# Patient Record
Sex: Female | Born: 1950 | Race: White | Hispanic: No | Marital: Married | Smoking: Former smoker
Health system: Southern US, Community
[De-identification: ages and names within clinical notes are randomized; demographics above are authoritative.]

## PROBLEM LIST (undated history)

## (undated) DIAGNOSIS — K219 Gastro-esophageal reflux disease without esophagitis: Secondary | ICD-10-CM

## (undated) DIAGNOSIS — E785 Hyperlipidemia, unspecified: Secondary | ICD-10-CM

## (undated) DIAGNOSIS — M1990 Unspecified osteoarthritis, unspecified site: Secondary | ICD-10-CM

## (undated) DIAGNOSIS — R112 Nausea with vomiting, unspecified: Secondary | ICD-10-CM

## (undated) DIAGNOSIS — Z9889 Other specified postprocedural states: Secondary | ICD-10-CM

## (undated) DIAGNOSIS — F419 Anxiety disorder, unspecified: Secondary | ICD-10-CM

## (undated) DIAGNOSIS — I447 Left bundle-branch block, unspecified: Secondary | ICD-10-CM

## (undated) DIAGNOSIS — I10 Essential (primary) hypertension: Secondary | ICD-10-CM

## (undated) DIAGNOSIS — R0600 Dyspnea, unspecified: Secondary | ICD-10-CM

## (undated) HISTORY — PX: UPPER GI ENDOSCOPY: SHX6162

## (undated) HISTORY — PX: BREAST SURGERY: SHX581

## (undated) HISTORY — PX: DEBRIDEMENT LEG: SUR390

## (undated) HISTORY — PX: TUBAL LIGATION: SHX77

## (undated) HISTORY — DX: Essential (primary) hypertension: I10

## (undated) HISTORY — PX: ANUS SURGERY: SHX302

## (undated) HISTORY — PX: CHOLECYSTECTOMY: SHX55

## (undated) HISTORY — DX: Hyperlipidemia, unspecified: E78.5

## (undated) HISTORY — PX: COLONOSCOPY: SHX174

## (undated) HISTORY — DX: Left bundle-branch block, unspecified: I44.7

## (undated) HISTORY — PX: OTHER SURGICAL HISTORY: SHX169

---

## 2001-12-18 ENCOUNTER — Encounter: Admission: RE | Admit: 2001-12-18 | Discharge: 2001-12-18 | Payer: Self-pay

## 2002-06-14 ENCOUNTER — Encounter: Admission: RE | Admit: 2002-06-14 | Discharge: 2002-06-14 | Payer: Self-pay

## 2005-03-29 ENCOUNTER — Encounter: Admission: RE | Admit: 2005-03-29 | Discharge: 2005-03-29 | Payer: Self-pay | Admitting: *Deleted

## 2009-09-25 ENCOUNTER — Encounter: Admission: RE | Admit: 2009-09-25 | Discharge: 2009-09-25 | Payer: Self-pay

## 2009-09-30 ENCOUNTER — Encounter: Admission: RE | Admit: 2009-09-30 | Discharge: 2009-09-30 | Payer: Self-pay

## 2011-01-20 ENCOUNTER — Other Ambulatory Visit: Payer: Self-pay | Admitting: *Deleted

## 2011-01-20 DIAGNOSIS — Z1231 Encounter for screening mammogram for malignant neoplasm of breast: Secondary | ICD-10-CM

## 2011-01-27 ENCOUNTER — Ambulatory Visit
Admission: RE | Admit: 2011-01-27 | Discharge: 2011-01-27 | Disposition: A | Discharge status: Home / Self Care | Payer: BC Managed Care – PPO | Source: Ambulatory Visit | Attending: *Deleted | Admitting: *Deleted

## 2011-01-27 DIAGNOSIS — Z1231 Encounter for screening mammogram for malignant neoplasm of breast: Secondary | ICD-10-CM

## 2012-02-02 ENCOUNTER — Other Ambulatory Visit: Payer: Self-pay | Admitting: *Deleted

## 2012-02-02 ENCOUNTER — Other Ambulatory Visit: Payer: Self-pay | Admitting: Obstetrics & Gynecology

## 2012-02-02 DIAGNOSIS — Z1231 Encounter for screening mammogram for malignant neoplasm of breast: Secondary | ICD-10-CM

## 2012-02-17 ENCOUNTER — Other Ambulatory Visit: Payer: Self-pay

## 2012-02-17 ENCOUNTER — Ambulatory Visit
Admission: RE | Admit: 2012-02-17 | Discharge: 2012-02-17 | Disposition: A | Discharge status: Home / Self Care | Payer: BC Managed Care – PPO | Source: Ambulatory Visit | Attending: *Deleted | Admitting: *Deleted

## 2012-02-17 DIAGNOSIS — Z1231 Encounter for screening mammogram for malignant neoplasm of breast: Secondary | ICD-10-CM

## 2013-03-18 ENCOUNTER — Other Ambulatory Visit: Payer: Self-pay

## 2013-03-18 DIAGNOSIS — Z1231 Encounter for screening mammogram for malignant neoplasm of breast: Secondary | ICD-10-CM

## 2013-04-15 ENCOUNTER — Ambulatory Visit
Admission: RE | Admit: 2013-04-15 | Discharge: 2013-04-15 | Disposition: A | Discharge status: Home / Self Care | Payer: BC Managed Care – PPO | Source: Ambulatory Visit

## 2013-04-15 DIAGNOSIS — Z1231 Encounter for screening mammogram for malignant neoplasm of breast: Secondary | ICD-10-CM

## 2015-06-18 ENCOUNTER — Telehealth: Payer: Self-pay | Admitting: Cardiovascular Disease

## 2015-06-18 NOTE — Telephone Encounter (Signed)
Received records from [facility] for appointment on 07/14/15 with Dr Royann Shiversroitoru.  [facility] for Dr Croitoru's schedule on 07/14/15. lp

## 2015-07-14 ENCOUNTER — Encounter: Payer: Self-pay | Admitting: Cardiovascular Disease

## 2015-07-14 ENCOUNTER — Ambulatory Visit (INDEPENDENT_AMBULATORY_CARE_PROVIDER_SITE_OTHER): Payer: BC Managed Care – PPO | Admitting: Cardiovascular Disease

## 2015-07-14 VITALS — BP 189/62 | HR 56 | Resp 16 | Ht 64.0 in | Wt 160.0 lb

## 2015-07-14 DIAGNOSIS — R079 Chest pain, unspecified: Secondary | ICD-10-CM | POA: Diagnosis not present

## 2015-07-14 DIAGNOSIS — E785 Hyperlipidemia, unspecified: Secondary | ICD-10-CM

## 2015-07-14 DIAGNOSIS — I2089 Other forms of angina pectoris: Secondary | ICD-10-CM

## 2015-07-14 DIAGNOSIS — I447 Left bundle-branch block, unspecified: Secondary | ICD-10-CM | POA: Insufficient documentation

## 2015-07-14 DIAGNOSIS — R0609 Other forms of dyspnea: Secondary | ICD-10-CM | POA: Diagnosis not present

## 2015-07-14 DIAGNOSIS — I10 Essential (primary) hypertension: Secondary | ICD-10-CM

## 2015-07-14 DIAGNOSIS — I208 Other forms of angina pectoris: Secondary | ICD-10-CM | POA: Diagnosis not present

## 2015-07-14 HISTORY — DX: Essential (primary) hypertension: I10

## 2015-07-14 HISTORY — DX: Left bundle-branch block, unspecified: I44.7

## 2015-07-14 HISTORY — DX: Hyperlipidemia, unspecified: E78.5

## 2015-07-14 NOTE — Progress Notes (Signed)
Patient [place]: [place], female   DOB: October 14, 1950, 65 y.o.   MRN: [id number]     Cardiology Office Note   Date:  07/14/2015   [place]:  [place], DOB [birth year], MRN [id number]  PCP:  No primary care provider on file.  Cardiologist:   Thurmon Fair, MD   Chief Complaint  Patient presents with  . New Patient (Initial Visit)  . Shortness of Breath    walking alot      History of Present Illness: [place] is a 64 y.o. female who presents for  Evaluation of exertional dyspnea.   [place] is married to [place], whom I recently saw for syncope and loop recorder implantation.  She has noticed gradually worsening exertional tolerance over the last 9-12 months. She complained initially of dyspnea. This happens when she climbs the stairs to the church balcony or when she is  Carrying groceries up the steps at home. As we were talking, it also became clear that the dyspnea also associates a sensation of pressure in her throat and lower jaw. The symptoms improve gradually after a few minutes of rest.  She denies syncope, palpitations, edema, claudication , focal neurological deficits are other vascular symptoms.   she has noticed that her symptoms of exertional throat tightness and dyspnea are worse if she is delayed in taking her usual atenolol dose.  She was initially diagnosed with a left bundle branch block about 12 years ago and at that time underwent an echocardiogram and a vasodilator myocardial nuclear perfusion study , both normal.  The nuclear study reported LVEF of 48% with hypokinesis of the distal third of the anterior wall ( probably LBBB-related asynchrony) , whilst the echo reported EF 55-65 percent with normal wall motion. No valvular abnormalities seen.  She smoked for about 10 years, 1 pack a day and quit in 1982. Her mother used nitroglycerin for angina.  She usually takes a statin, currently interrupted temporarily to allow use of terbinafine for  onychomycosis  She lives in [place], but is in [place] 5 days a week providing care for her 49-year-old and 7-month-old grandchildren  Past Medical History  Diagnosis Date  . Hyperlipidemia 07/14/2015  . Essential hypertension 07/14/2015  . LBBB (left bundle branch block) 07/14/2015    History reviewed. No pertinent past surgical history.   Current Outpatient Prescriptions  Medication Sig Dispense Refill  . atenolol-chlorthalidone (TENORETIC) 50-25 MG tablet Take 1 tablet by mouth daily.    . Cholecalciferol (VITAMIN D3) 2000 UNITS TABS Take 2,000 Units by mouth daily.    Marland Kitchen doxycycline (VIBRA-TABS) 100 MG tablet Take 100 mg by mouth 3 (three) times a week. 1-3 times weekly    . Multiple Vitamins-Minerals (MULTIVITAMIN ADULT PO) Take by mouth daily.    Marland Kitchen POTASSIUM CHLORIDE PO Take 10 mEq by mouth daily.    . pravastatin (PRAVACHOL) 40 MG tablet Take 40 mg by mouth daily.    Marland Kitchen terbinafine (LAMISIL) 250 MG tablet Take 250 mg by mouth daily.     No current facility-administered medications for this visit.    Allergies:   Review of patient's allergies indicates no known allergies.    Social History:  The patient  reports that she has quit smoking. She has never used smokeless tobacco. She reports that she drinks alcohol. She reports that she does not use illicit drugs.   Family History:  The patient's family history includes Diabetes in her mother; Hypertension in her mother; Lung cancer in her father.  ROS:  Please see the history of present illness.    Otherwise, review of systems positive for none.   All other systems are reviewed and negative.    PHYSICAL EXAM: VS:  BP 189/62 mmHg  Pulse 56  Resp 16  Ht 5\' 4"  (1.626 m)  Wt 160 lb (72.576 kg)  BMI 27.45 kg/m2 , BMI Body mass index is 27.45 kg/(m^2).  General: Alert, oriented x3, no distress Head: no evidence of trauma, PERRL, EOMI, no exophtalmos or lid lag, no myxedema, no xanthelasma; normal ears, nose and  oropharynx Neck: normal jugular venous pulsations and no hepatojugular reflux; brisk carotid pulses without delay and no carotid bruits Chest: clear to auscultation, no signs of consolidation by percussion or palpation, normal fremitus, symmetrical and full respiratory excursions Cardiovascular: normal position and quality of the apical impulse, regular rhythm, normal first and second heart sounds, no murmurs, rubs or gallops Abdomen: no tenderness or distention, no masses by palpation, no abnormal pulsatility or arterial bruits, normal bowel sounds, no hepatosplenomegaly Extremities: no clubbing, cyanosis or edema; 2+ radial, ulnar and brachial pulses bilaterally; 2+ right femoral, posterior tibial and dorsalis pedis pulses; 2+ left femoral, posterior tibial and dorsalis pedis pulses; no subclavian or femoral bruits Neurological: grossly nonfocal Psych: euthymic mood, full affect   EKG:  EKG is ordered today. The ekg ordered today demonstrates NSR, LBBB   Recent Labs:  hemoglobin A1c 6.1%, uric acid 9.4 , potassium 3.8, creatinine 0.99, normal LFTs    Lipid Panel  total cholesterol 212, triglycerides 156, HDL 62, LDL 119    Wt Readings from Last 3 Encounters:  07/14/15 160 lb (72.576 kg)      Other studies Reviewed: Additional studies/ records that were reviewed today include:  [facility] labs [place] [facility] [place]   ASSESSMENT AND PLAN:    Mrs. Willaim Baneark describes fairly typical exertional angina pectoris and exertional dyspnea and has numerous risk factors for coronary artery disease including family history of CAD and a female relative, mild hyperlipidemia, borderline diabetes mellitus treated hypertension.  Her echocardiogram is nondiagnostic due to the presence of a chronic left bundle branch block. I have recommended that she have a Lexiscan Myoview and an echocardiogram. As long as his symptoms remain in a stable/exertional pattern, there is no urgency to  her evaluation. However, should she have persistent chest/throat discomfort that lasts for more than 20-25 minutes or if there is any pattern of progressive/accelerating symptoms she should seek urgent medical attention.   If her stress test is abnormal she should have a cardiac catheterization and coronary angiography. We actually discussed the risks/benefits/potential complications of diagnostic cardiac catheterization as well as of potential percutaneous angioplasty and stent placement in detail today.   her blood pressure is quite high today , but this is atypical. May need adjustment in her antihypertensive regimen but will get the opportunity to repeatedly check her blood pressure when she comes in for her nuclear test.   Reviewed the risk of beta blocker rebound symptoms.  Current medicines are reviewed at length with the patient today.  The patient does not have concerns regarding medicines.  The following changes have been made:  no change  Labs/ tests ordered today include:  Orders Placed This Encounter  Procedures  . Myocardial Perfusion Imaging  . EKG 12-Lead  . ECHOCARDIOGRAM COMPLETE    Patient Instructions  Your physician has requested that you have an echocardiogram. Echocardiography is a painless test that uses sound waves to  create images of your heart. It provides your doctor with information about the size and shape of your heart and how well your heart's chambers and valves are working. This procedure takes approximately one hour. There are no restrictions for this procedure.  Your physician has requested that you have a lexiscan myoview. For further information please visit [email or web address] Please follow instruction sheet, as given.  Dr. Royann Shivers recommends that you schedule a follow-up appointment in: ONE MONTH       Signed, Thurmon Fair, MD  07/14/2015 1:27 PM    Thurmon Fair, MD, [facility] [phone] office [phone] pager

## 2015-07-14 NOTE — Patient Instructions (Signed)
Your physician has requested that you have an echocardiogram. Echocardiography is a painless test that uses sound waves to create images of your heart. It provides your doctor with information about the size and shape of your heart and how well your heart's chambers and valves are working. This procedure takes approximately one hour. There are no restrictions for this procedure.  Your physician has requested that you have a lexiscan myoview. For further information please visit [email or web address]. Please follow instruction sheet, as given.  Dr. Royann Shiversroitoru recommends that you schedule a follow-up appointment in: ONE MONTH

## 2015-07-22 ENCOUNTER — Telehealth (HOSPITAL_COMMUNITY): Payer: Self-pay

## 2015-07-22 NOTE — Telephone Encounter (Signed)
Encounter complete. 

## 2015-07-24 ENCOUNTER — Ambulatory Visit (HOSPITAL_COMMUNITY)
Admission: RE | Admit: 2015-07-24 | Discharge: 2015-07-24 | Disposition: A | Discharge status: Home / Self Care | Payer: BC Managed Care – PPO | Source: Ambulatory Visit | Attending: Cardiovascular Disease | Admitting: Cardiovascular Disease

## 2015-07-24 DIAGNOSIS — I447 Left bundle-branch block, unspecified: Secondary | ICD-10-CM | POA: Insufficient documentation

## 2015-07-24 DIAGNOSIS — Z87891 Personal history of nicotine dependence: Secondary | ICD-10-CM | POA: Insufficient documentation

## 2015-07-24 DIAGNOSIS — R0609 Other forms of dyspnea: Secondary | ICD-10-CM | POA: Diagnosis not present

## 2015-07-24 DIAGNOSIS — R5383 Other fatigue: Secondary | ICD-10-CM | POA: Insufficient documentation

## 2015-07-24 DIAGNOSIS — R079 Chest pain, unspecified: Secondary | ICD-10-CM

## 2015-07-24 DIAGNOSIS — R002 Palpitations: Secondary | ICD-10-CM | POA: Insufficient documentation

## 2015-07-24 DIAGNOSIS — Z8249 Family history of ischemic heart disease and other diseases of the circulatory system: Secondary | ICD-10-CM | POA: Insufficient documentation

## 2015-07-24 DIAGNOSIS — I10 Essential (primary) hypertension: Secondary | ICD-10-CM | POA: Diagnosis not present

## 2015-07-24 LAB — MYOCARDIAL PERFUSION IMAGING
CHL CUP NUCLEAR SRS: 3
CHL CUP NUCLEAR SSS: 4
CSEPPHR: 85 {beats}/min
LV dias vol: 104 mL
LVSYSVOL: 47 mL
NUC STRESS TID: 1.21
Rest HR: 55 {beats}/min
SDS: 1

## 2015-07-24 MED ORDER — TECHNETIUM TC 99M SESTAMIBI GENERIC - CARDIOLITE
32.0000 | Freq: Once | INTRAVENOUS | Status: AC | PRN
  Administered 2015-07-24: 32 via INTRAVENOUS

## 2015-07-24 MED ORDER — AMINOPHYLLINE 25 MG/ML IV SOLN
75.0000 mg | Freq: Once | INTRAVENOUS | Status: AC
  Administered 2015-07-24: 75 mg via INTRAVENOUS

## 2015-07-24 MED ORDER — REGADENOSON 0.4 MG/5ML IV SOLN
0.4000 mg | Freq: Once | INTRAVENOUS | Status: AC
  Administered 2015-07-24: 0.4 mg via INTRAVENOUS

## 2015-07-24 MED ORDER — TECHNETIUM TC 99M SESTAMIBI GENERIC - CARDIOLITE
10.7000 | Freq: Once | INTRAVENOUS | Status: AC | PRN
  Administered 2015-07-24: 10.7 via INTRAVENOUS

## 2015-07-27 ENCOUNTER — Other Ambulatory Visit: Payer: Self-pay

## 2015-07-27 ENCOUNTER — Ambulatory Visit (HOSPITAL_COMMUNITY): Payer: BC Managed Care – PPO | Attending: Cardiology

## 2015-07-27 DIAGNOSIS — E785 Hyperlipidemia, unspecified: Secondary | ICD-10-CM | POA: Diagnosis not present

## 2015-07-27 DIAGNOSIS — I059 Rheumatic mitral valve disease, unspecified: Secondary | ICD-10-CM | POA: Diagnosis not present

## 2015-07-27 DIAGNOSIS — I10 Essential (primary) hypertension: Secondary | ICD-10-CM | POA: Diagnosis not present

## 2015-07-27 DIAGNOSIS — R079 Chest pain, unspecified: Secondary | ICD-10-CM

## 2015-07-27 DIAGNOSIS — I351 Nonrheumatic aortic (valve) insufficiency: Secondary | ICD-10-CM | POA: Diagnosis not present

## 2015-08-10 ENCOUNTER — Ambulatory Visit (INDEPENDENT_AMBULATORY_CARE_PROVIDER_SITE_OTHER): Payer: Medicare Other | Admitting: Cardiovascular Disease

## 2015-08-10 VITALS — BP 152/76 | HR 76 | Ht 64.0 in | Wt 162.5 lb

## 2015-08-10 DIAGNOSIS — I447 Left bundle-branch block, unspecified: Secondary | ICD-10-CM | POA: Diagnosis not present

## 2015-08-10 DIAGNOSIS — I208 Other forms of angina pectoris: Secondary | ICD-10-CM

## 2015-08-10 DIAGNOSIS — I10 Essential (primary) hypertension: Secondary | ICD-10-CM

## 2015-08-10 DIAGNOSIS — E785 Hyperlipidemia, unspecified: Secondary | ICD-10-CM

## 2015-08-10 DIAGNOSIS — R0609 Other forms of dyspnea: Secondary | ICD-10-CM

## 2015-08-10 MED ORDER — AMLODIPINE BESYLATE 5 MG PO TABS: 5.0000 mg | ORAL_TABLET | Freq: Every day | ORAL | Status: DC

## 2015-08-10 NOTE — Patient Instructions (Signed)
Your physician has recommended you make the following change in your medication: START AMLODIPINE 5 MG DAILY  If you need a refill on your cardiac medications before your next appointment, please call your pharmacy.  Your physician has requested that you regularly monitor and record your blood pressure readings at home. Please use the same machine at the same time of day to check your readings and record them AND SEND READINGS AFTER ONE WEEK THROUGH MYCHART.  Dr. Croitoru rRoyann Shiversecommends that you schedule a follow-up appointment in: ONE YEAR    Blood Pressure Record Sheet  BLOOD PRESSURE LOG Date: _______________________  a.m. _____________________  p.m. _____________________ Date: _______________________  a.m. _____________________  p.m. _____________________ Date: _______________________  a.m. _____________________  p.m. _____________________ Date: _______________________  a.m. _____________________  p.m. _____________________ Date: _______________________  a.m. _____________________  p.m. _____________________   This information is not intended to replace advice given to you by your health care provider. Make sure you discuss any questions you have with your health care provider.   Document Released: 05/21/2003 Document Revised: 09/12/2014 Document Reviewed: 10/15/2013 Elsevier Interactive Patient Education [employer]2016 Elsevier Inc.

## 2015-08-12 ENCOUNTER — Encounter: Payer: Self-pay | Admitting: Cardiovascular Disease

## 2015-08-12 NOTE — Progress Notes (Signed)
Patient ID: [place], female   DOB: November 19, 1950, 64 y.o.   MRN: [id number]     Cardiology Office Note   Date:  08/12/2015   ID:  [place], DOB November 19, 1950, MRN [id number]  PCP:  Pcp Not In System  Cardiologist:   [place] FairROITORU,Foch Rosenwald, MD   Chief Complaint  Patient presents with  . Follow-up    pt states no chest pain, no edema no light headedness or dizziness, pt states she hasn't taken A.M. meds so BP is elevated  . Shortness of Breath    only with exertion      History of Present Illness: [place] is a 64 y.o. female who presents for  Follow-up after undergoing echocardiogram and a nuclear stress testing for complaints of exertional dyspnea and chest discomfort. When she came for her  Nuclear stress test, her blood pressure was persistently and consistently elevated. Today in the office her blood pressure is again high. The highest blood pressure during Lexiscan infusion was 185 /67 mmHg. Both the echocardiogram and a nuclear stress test showed benign findings. There was no evidence of reversible ischemia and left ventricular systolic function was normal.  It is quite possible that she has hypertension related complaints of exertional chest discomfort and dyspnea.    Past Medical History  Diagnosis Date  . Hyperlipidemia 07/14/2015  . Essential hypertension 07/14/2015  . LBBB (left bundle branch block) 07/14/2015    No past surgical history on file.   Current Outpatient Prescriptions  Medication Sig Dispense Refill  . atenolol-chlorthalidone (TENORETIC) 50-25 MG tablet Take 1 tablet by mouth daily.    . Cholecalciferol (VITAMIN D3) 2000 UNITS TABS Take 2,000 Units by mouth daily.    Marland Kitchen. doxycycline (VIBRAMYCIN) 100 MG capsule Take 100 mg by mouth as needed.    . Multiple Vitamins-Minerals (MULTIVITAMIN ADULT PO) Take by mouth daily.    Marland Kitchen. POTASSIUM CHLORIDE PO Take 10 mEq by mouth daily.    . pravastatin (PRAVACHOL) 40 MG tablet Take 40 mg by mouth daily.    Marland Kitchen.  amLODipine (NORVASC) 5 MG tablet Take 1 tablet (5 mg total) by mouth daily. 180 tablet 3   No current facility-administered medications for this visit.    Allergies:   Review of patient's allergies indicates no known allergies.    Social History:  The patient  reports that she has quit smoking. She has never used smokeless tobacco. She reports that she drinks alcohol. She reports that she does not use illicit drugs.   Family History:  The patient's family history includes Diabetes in her mother; Hypertension in her mother; Lung cancer in her father.    ROS:  Please see the history of present illness.    Otherwise, review of systems positive for none.   All other systems are reviewed and negative.    PHYSICAL EXAM: VS:  BP 152/76 mmHg  Pulse 76  Ht 5\' 4"  (1.626 m)  Wt 162 lb 8 oz (73.71 kg)  BMI 27.88 kg/m2 , BMI Body mass index is 27.88 kg/(m^2).  General: Alert, oriented x3, no distress Head: no evidence of trauma, PERRL, EOMI, no exophtalmos or lid lag, no myxedema, no xanthelasma; normal ears, nose and oropharynx Neck: normal jugular venous pulsations and no hepatojugular reflux; brisk carotid pulses without delay and no carotid bruits Chest: clear to auscultation, no signs of consolidation by percussion or palpation, normal fremitus, symmetrical and full respiratory excursions Cardiovascular: normal position and quality of the apical impulse, regular rhythm, normal  first and paradoxically split second heart sounds, no murmurs, rubs or gallops Abdomen: no tenderness or distention, no masses by palpation, no abnormal pulsatility or arterial bruits, normal bowel sounds, no hepatosplenomegaly Extremities: no clubbing, cyanosis or edema; 2+ radial, ulnar and brachial pulses bilaterally; 2+ right femoral, posterior tibial and dorsalis pedis pulses; 2+ left femoral, posterior tibial and dorsalis pedis pulses; no subclavian or femoral bruits Neurological: grossly nonfocal Psych: euthymic  mood, full affect   EKG:  EKG is not ordered today  Recent Labs: No results found for requested labs within last 365 days.    Lipid Panel No results found for: CHOL, TRIG, HDL, CHOLHDL, VLDL, LDLCALC, LDLDIRECT    Wt Readings from Last 3 Encounters:  08/10/15 162 lb 8 oz (73.71 kg)  07/24/15 160 lb (72.576 kg)  07/14/15 160 lb (72.576 kg)      ASSESSMENT AND PLAN:  1.  Atypical chest discomfort and exertional dyspnea.  I now believe that her symptoms may be related to  Insufficiently treated systemic hypertension. She is oriented on a fixed dose beta blocker/diuretic combination. Rather than change this I think it may be wiser to add another antihypertensive with a different mechanism of action. I recommend amlodipine 5 mg daily. I told her that this is a relatively slow medication and that the full impact on her symptoms and her blood pressure may take a couple of weeks. I have recommended that she check her blood pressure at rest at home and send me a log of her blood pressure readings.  2.  Chronic left bundle branch block  3.  Hyperlipidemia on statin  4.  Mild diastolic dysfunction by echocardiography without evidence for elevated filling pressures at rest ; preserved left ventricular systolic function  5.  If her symptoms fail to respond to normalization of blood pressure , we may still be compelled to proceed with invasive evaluation with right left heart catheterization. She has numerous coronary risk factors in addition to high blood pressure and hyperlipidemia she has borderline diabetes mellitus and history of early coronary disease in a female first-degree relative.    Current medicines are reviewed at length with the patient today.  The patient does not have concerns regarding medicines.  Labs/ tests ordered today include:  No orders of the defined types were placed in this encounter.     Patient Instructions  Your physician has recommended you make the  following change in your medication: START AMLODIPINE 5 MG DAILY  If you need a refill on your cardiac medications before your next appointment, please call your pharmacy.  Your physician has requested that you regularly monitor and record your blood pressure readings at home. Please use the same machine at the same time of day to check your readings and record them AND SEND READINGS AFTER ONE WEEK THROUGH MYCHART.  Dr. Royann Shivers recommends that you schedule a follow-up appointment in: ONE YEAR    Blood Pressure Record Sheet  BLOOD PRESSURE LOG Date: _______________________  a.m. _____________________  p.m. _____________________ Date: _______________________  a.m. _____________________  p.m. _____________________ Date: _______________________  a.m. _____________________  p.m. _____________________ Date: _______________________  a.m. _____________________  p.m. _____________________ Date: _______________________  a.m. _____________________  p.m. _____________________   This information is not intended to replace advice given to you by your health care provider. Make sure you discuss any questions you have with your health care provider.   Document Released: 05/21/2003 Document Revised: 09/12/2014 Document Reviewed: 10/15/2013 Elsevier Interactive Patient Education [employer]  Joie Bimler, MD  08/12/2015 5:38 PM    [place], MD, [facility] [phone] office [phone] pager

## 2016-02-03 ENCOUNTER — Other Ambulatory Visit: Payer: Self-pay | Admitting: Family Medicine

## 2016-02-03 DIAGNOSIS — Z1231 Encounter for screening mammogram for malignant neoplasm of breast: Secondary | ICD-10-CM

## 2016-02-10 ENCOUNTER — Ambulatory Visit
Admission: RE | Admit: 2016-02-10 | Discharge: 2016-02-10 | Disposition: A | Discharge status: Home / Self Care | Payer: Medicare Other | Source: Ambulatory Visit | Attending: Family Medicine | Admitting: Family Medicine

## 2016-02-10 DIAGNOSIS — Z1231 Encounter for screening mammogram for malignant neoplasm of breast: Secondary | ICD-10-CM

## 2017-05-12 ENCOUNTER — Other Ambulatory Visit: Payer: Self-pay | Admitting: Obstetrics and Gynecology

## 2017-05-12 DIAGNOSIS — Z1231 Encounter for screening mammogram for malignant neoplasm of breast: Secondary | ICD-10-CM

## 2017-05-31 NOTE — H&P (Addendum)
[place] is an 66 y.o. female presents for surgical evaluation of thickened endometrium and possible endometrial polyp  Menstrual History: No LMP recorded. Patient is postmenopausal.    Past Medical History:  Diagnosis Date  . Essential hypertension 07/14/2015  . Hyperlipidemia 07/14/2015  . LBBB (left bundle branch block) 07/14/2015    No past surgical history on file.  Family History  Problem Relation Age of Onset  . Diabetes Mother   . Hypertension Mother   . Lung cancer Father     Social History:  reports that she has quit smoking. She has never used smokeless tobacco. She reports that she drinks alcohol. She reports that she does not use drugs.  Allergies: No Known Allergies  No prescriptions prior to admission.    ROS + abdominal cramping and bloating   Physical Exam  Gen - NAD Abd - soft, NT Ext - no edema PV - uterus mobile, NT, no adnexal masses  PV [place];  12 mm EMS, no uterine fibroids, adnexal masses or free fluid  Assessment/Plan:  Thickened endometrium Hysteroscopy, d&c R/b/a discussed, questions answered, informed consent  [place] 05/31/2017, 1:31 PM

## 2017-06-05 NOTE — Patient Instructions (Addendum)
Your procedure is scheduled on:  Friday, Oct. 12, 2018  Enter through the [employer] [facility] at:  6:00 AM  Pick up the phone at the desk and dial [phone].  Call this number if you have problems the morning of surgery: [phone].  Remember: Do NOT eat food or drink after:  Midnight Thursday  Take these medicines the morning of surgery with a SIP OF WATER: None  Stop ALL herbal medications at this time  Do NOT smoke the day of surgery.  Do NOT wear jewelry (body piercing), metal hair clips/bobby pins, make-up, artifical eyelashes or nail polish. Do NOT wear lotions, powders, or perfumes.  You may wear deodorant. Do NOT shave for 48 hours prior to surgery. Do NOT bring valuables to the hospital. Contacts, dentures, or bridgework may not be worn into surgery.  Have a responsible adult drive you home and stay with you for 24 hours after your procedure  Bring a copy of your healthcare power of attorney and living will documents.

## 2017-06-06 ENCOUNTER — Encounter (HOSPITAL_COMMUNITY)
Admission: RE | Admit: 2017-06-06 | Discharge: 2017-06-06 | Disposition: A | Discharge status: Home / Self Care | Payer: Medicare Other | Source: Ambulatory Visit | Attending: Obstetrics and Gynecology | Admitting: Obstetrics and Gynecology

## 2017-06-06 ENCOUNTER — Other Ambulatory Visit: Payer: Self-pay

## 2017-06-06 ENCOUNTER — Encounter (HOSPITAL_COMMUNITY): Payer: Self-pay

## 2017-06-06 DIAGNOSIS — Z01818 Encounter for other preprocedural examination: Secondary | ICD-10-CM | POA: Insufficient documentation

## 2017-06-06 HISTORY — DX: Anxiety disorder, unspecified: F41.9

## 2017-06-06 HISTORY — DX: Dyspnea, unspecified: R06.00

## 2017-06-06 HISTORY — DX: Unspecified osteoarthritis, unspecified site: M19.90

## 2017-06-06 HISTORY — DX: Other specified postprocedural states: Z98.890

## 2017-06-06 HISTORY — DX: Nausea with vomiting, unspecified: R11.2

## 2017-06-06 HISTORY — DX: Gastro-esophageal reflux disease without esophagitis: K21.9

## 2017-06-06 LAB — CBC
HEMATOCRIT: 41 % (ref 36.0–46.0)
HEMOGLOBIN: 13.9 g/dL (ref 12.0–15.0)
MCH: 31.4 pg (ref 26.0–34.0)
MCHC: 33.9 g/dL (ref 30.0–36.0)
MCV: 92.6 fL (ref 78.0–100.0)
Platelets: 238 10*3/uL (ref 150–400)
RBC: 4.43 MIL/uL (ref 3.87–5.11)
RDW: 12 % (ref 11.5–15.5)
WBC: 6.5 10*3/uL (ref 4.0–10.5)

## 2017-06-06 LAB — COMPREHENSIVE METABOLIC PANEL
ALBUMIN: 4.1 g/dL (ref 3.5–5.0)
ALK PHOS: 66 U/L (ref 38–126)
ALT: 22 U/L (ref 14–54)
AST: 24 U/L (ref 15–41)
Anion gap: 10 (ref 5–15)
BILIRUBIN TOTAL: 0.7 mg/dL (ref 0.3–1.2)
BUN: 24 mg/dL — AB (ref 6–20)
CO2: 26 mmol/L (ref 22–32)
CREATININE: 1.03 mg/dL — AB (ref 0.44–1.00)
Calcium: 8.8 mg/dL — ABNORMAL LOW (ref 8.9–10.3)
Chloride: 97 mmol/L — ABNORMAL LOW (ref 101–111)
GFR calc Af Amer: >60 mL/min (ref >60)
GFR, EST NON AFRICAN AMERICAN: 55 mL/min — AB (ref >60)
Glucose, Bld: 114 mg/dL — ABNORMAL HIGH (ref 65–99)
Potassium: 2.8 mmol/L — ABNORMAL LOW (ref 3.5–5.1)
Sodium: 133 mmol/L — ABNORMAL LOW (ref 135–145)
TOTAL PROTEIN: 7.2 g/dL (ref 6.5–8.1)

## 2017-06-06 NOTE — Pre-Procedure Instructions (Addendum)
Dr. Acey Lav made aware of low potassium, requested repeat BMP day of surgery, no further orders at this time.

## 2017-06-06 NOTE — Pre-Procedure Instructions (Signed)
Left a voicemail for [name] at Dr. Garlan Fillers office to make them aware of low potassium level for [place].

## 2017-06-16 ENCOUNTER — Encounter (HOSPITAL_COMMUNITY)
Admission: AD | Disposition: A | Discharge status: Home / Self Care | Payer: Self-pay | Source: Ambulatory Visit | Attending: Obstetrics and Gynecology

## 2017-06-16 ENCOUNTER — Ambulatory Visit (HOSPITAL_COMMUNITY): Payer: Medicare Other | Admitting: Anesthesiology

## 2017-06-16 ENCOUNTER — Encounter (HOSPITAL_COMMUNITY): Payer: Self-pay

## 2017-06-16 ENCOUNTER — Ambulatory Visit (HOSPITAL_COMMUNITY)
Admission: AD | Admit: 2017-06-16 | Discharge: 2017-06-16 | Disposition: A | Discharge status: Home / Self Care | Payer: Medicare Other | Source: Ambulatory Visit | Attending: Obstetrics and Gynecology | Admitting: Obstetrics and Gynecology

## 2017-06-16 DIAGNOSIS — I10 Essential (primary) hypertension: Secondary | ICD-10-CM | POA: Insufficient documentation

## 2017-06-16 DIAGNOSIS — Z79899 Other long term (current) drug therapy: Secondary | ICD-10-CM | POA: Diagnosis not present

## 2017-06-16 DIAGNOSIS — N858 Other specified noninflammatory disorders of uterus: Secondary | ICD-10-CM | POA: Diagnosis not present

## 2017-06-16 DIAGNOSIS — Z87891 Personal history of nicotine dependence: Secondary | ICD-10-CM | POA: Insufficient documentation

## 2017-06-16 DIAGNOSIS — E785 Hyperlipidemia, unspecified: Secondary | ICD-10-CM | POA: Insufficient documentation

## 2017-06-16 HISTORY — PX: HYSTEROSCOPY W/D&C: SHX1775

## 2017-06-16 LAB — BASIC METABOLIC PANEL
ANION GAP: 8 (ref 5–15)
BUN: 18 mg/dL (ref 6–20)
CALCIUM: 8.7 mg/dL — AB (ref 8.9–10.3)
CO2: 28 mmol/L (ref 22–32)
CREATININE: 1.1 mg/dL — AB (ref 0.44–1.00)
Chloride: 104 mmol/L (ref 101–111)
GFR, EST AFRICAN AMERICAN: 59 mL/min — AB (ref >60)
GFR, EST NON AFRICAN AMERICAN: 51 mL/min — AB (ref >60)
GLUCOSE: 132 mg/dL — AB (ref 65–99)
Potassium: 3 mmol/L — ABNORMAL LOW (ref 3.5–5.1)
Sodium: 140 mmol/L (ref 135–145)

## 2017-06-16 LAB — CBC
HCT: 36.9 % (ref 36.0–46.0)
HEMOGLOBIN: 12.1 g/dL (ref 12.0–15.0)
MCH: 30.8 pg (ref 26.0–34.0)
MCHC: 32.8 g/dL (ref 30.0–36.0)
MCV: 93.9 fL (ref 78.0–100.0)
Platelets: 197 10*3/uL (ref 150–400)
RBC: 3.93 MIL/uL (ref 3.87–5.11)
RDW: 12.3 % (ref 11.5–15.5)
WBC: 5.5 10*3/uL (ref 4.0–10.5)

## 2017-06-16 SURGERY — DILATATION AND CURETTAGE /HYSTEROSCOPY
Anesthesia: General

## 2017-06-16 MED ORDER — KETOROLAC TROMETHAMINE 30 MG/ML IJ SOLN
INTRAMUSCULAR | Status: DC | PRN
  Administered 2017-06-16: 30 mg via INTRAVENOUS

## 2017-06-16 MED ORDER — LACTATED RINGERS IV SOLN: INTRAVENOUS | Status: DC

## 2017-06-16 MED ORDER — MIDAZOLAM HCL 2 MG/2ML IJ SOLN
INTRAMUSCULAR | Status: DC | PRN
  Administered 2017-06-16: 1 mg via INTRAVENOUS

## 2017-06-16 MED ORDER — PROPOFOL 10 MG/ML IV BOLUS
INTRAVENOUS | Status: DC | PRN
  Administered 2017-06-16: 180 mg via INTRAVENOUS

## 2017-06-16 MED ORDER — DEXAMETHASONE SODIUM PHOSPHATE 10 MG/ML IJ SOLN
INTRAMUSCULAR | Status: DC | PRN
  Administered 2017-06-16: 4 mg via INTRAVENOUS

## 2017-06-16 MED ORDER — MIDAZOLAM HCL 2 MG/2ML IJ SOLN
INTRAMUSCULAR | Status: AC
  Filled 2017-06-16: qty 2

## 2017-06-16 MED ORDER — LIDOCAINE HCL 1 % IJ SOLN
INTRAMUSCULAR | Status: AC
  Filled 2017-06-16: qty 1

## 2017-06-16 MED ORDER — ONDANSETRON HCL 4 MG/2ML IJ SOLN
INTRAMUSCULAR | Status: AC
  Filled 2017-06-16: qty 2

## 2017-06-16 MED ORDER — LIDOCAINE HCL (CARDIAC) 20 MG/ML IV SOLN
INTRAVENOUS | Status: AC
  Filled 2017-06-16: qty 5

## 2017-06-16 MED ORDER — FENTANYL CITRATE (PF) 100 MCG/2ML IJ SOLN
INTRAMUSCULAR | Status: DC | PRN
  Administered 2017-06-16 (×2): 50 ug via INTRAVENOUS

## 2017-06-16 MED ORDER — HYDROMORPHONE HCL 1 MG/ML IJ SOLN: 0.2500 mg | INTRAMUSCULAR | Status: DC | PRN

## 2017-06-16 MED ORDER — KETOROLAC TROMETHAMINE 30 MG/ML IJ SOLN
INTRAMUSCULAR | Status: AC
  Filled 2017-06-16: qty 1

## 2017-06-16 MED ORDER — PROMETHAZINE HCL 25 MG/ML IJ SOLN: 6.2500 mg | INTRAMUSCULAR | Status: DC | PRN

## 2017-06-16 MED ORDER — CEFOTETAN DISODIUM-DEXTROSE 2-2.08 GM-% IV SOLR
2.0000 g | INTRAVENOUS | Status: AC
  Administered 2017-06-16: 2 g via INTRAVENOUS

## 2017-06-16 MED ORDER — PROPOFOL 10 MG/ML IV BOLUS
INTRAVENOUS | Status: AC
  Filled 2017-06-16: qty 20

## 2017-06-16 MED ORDER — LIDOCAINE HCL (CARDIAC) 20 MG/ML IV SOLN
INTRAVENOUS | Status: DC | PRN
  Administered 2017-06-16: 70 mg via INTRAVENOUS
  Administered 2017-06-16: 30 mg via INTRAVENOUS

## 2017-06-16 MED ORDER — MEPERIDINE HCL 25 MG/ML IJ SOLN: 6.2500 mg | INTRAMUSCULAR | Status: DC | PRN

## 2017-06-16 MED ORDER — DEXAMETHASONE SODIUM PHOSPHATE 4 MG/ML IJ SOLN
INTRAMUSCULAR | Status: AC
  Filled 2017-06-16: qty 1

## 2017-06-16 MED ORDER — LIDOCAINE HCL 1 % IJ SOLN
INTRAMUSCULAR | Status: DC | PRN
  Administered 2017-06-16: 10 mL

## 2017-06-16 MED ORDER — FENTANYL CITRATE (PF) 100 MCG/2ML IJ SOLN
INTRAMUSCULAR | Status: AC
  Filled 2017-06-16: qty 2

## 2017-06-16 MED ORDER — OXYCODONE HCL 5 MG PO TABS: 5.0000 mg | ORAL_TABLET | Freq: Once | ORAL | Status: DC | PRN

## 2017-06-16 MED ORDER — CEFOTETAN DISODIUM-DEXTROSE 2-2.08 GM-% IV SOLR
INTRAVENOUS | Status: AC
  Filled 2017-06-16: qty 50

## 2017-06-16 MED ORDER — ONDANSETRON HCL 4 MG/2ML IJ SOLN
INTRAMUSCULAR | Status: DC | PRN
  Administered 2017-06-16: 4 mg via INTRAVENOUS

## 2017-06-16 MED ORDER — OXYCODONE HCL 5 MG/5ML PO SOLN: 5.0000 mg | Freq: Once | ORAL | Status: DC | PRN

## 2017-06-16 MED ORDER — LACTATED RINGERS IV SOLN
INTRAVENOUS | Status: DC
  Administered 2017-06-16: 06:00:00 via INTRAVENOUS

## 2017-06-16 SURGICAL SUPPLY — 18 items
ABLATOR ENDOMETRIAL BIPOLAR (ABLATOR) IMPLANT
BIPOLAR CUTTING LOOP 21FR (ELECTRODE)
CANISTER SUCT 3000ML PPV (MISCELLANEOUS) ×2 IMPLANT
CATH ROBINSON RED A/P 16FR (CATHETERS) ×2 IMPLANT
CONTAINER PREFILL 10% NBF 60ML (FORM) ×4 IMPLANT
ELECT COAG BIPOL BALL 21FR (ELECTRODE) IMPLANT
ELECT REM PT RETURN 9FT ADLT (ELECTROSURGICAL) ×2
ELECTRODE REM PT RTRN 9FT ADLT (ELECTROSURGICAL) ×1 IMPLANT
GLOVE BIO SURGEON STRL SZ 6.5 (GLOVE) ×2 IMPLANT
GLOVE BIOGEL PI IND STRL 7.0 (GLOVE) ×2 IMPLANT
GLOVE BIOGEL PI INDICATOR 7.0 (GLOVE) ×2
GOWN STRL REUS W/TWL LRG LVL3 (GOWN DISPOSABLE) ×4 IMPLANT
LOOP CUTTING BIPOLAR 21FR (ELECTRODE) IMPLANT
PACK VAGINAL MINOR WOMEN LF (CUSTOM PROCEDURE TRAY) ×2 IMPLANT
PAD OB MATERNITY 4.3X12.25 (PERSONAL CARE ITEMS) ×2 IMPLANT
TOWEL OR 17X24 6PK STRL BLUE (TOWEL DISPOSABLE) ×4 IMPLANT
TUBING AQUILEX INFLOW (TUBING) ×2 IMPLANT
TUBING AQUILEX OUTFLOW (TUBING) ×2 IMPLANT

## 2017-06-16 NOTE — Anesthesia Postprocedure Evaluation (Signed)
Anesthesia Post Note  Patient: [place]  Procedure(s) Performed: DILATATION AND CURETTAGE /HYSTEROSCOPY (N/A )     Patient location during evaluation: PACU Anesthesia Type: General Level of consciousness: sedated and patient cooperative Pain management: pain level controlled Vital Signs Assessment: post-procedure vital signs reviewed and stable Respiratory status: spontaneous breathing Cardiovascular status: stable Anesthetic complications: no    Last Vitals:  Vitals:   06/16/17 0930 06/16/17 1006  BP: 123/64 (!) 128/55  Pulse: 61 61  Resp: 14 16  Temp:    SpO2: 93% 97%    Last Pain:  Vitals:   06/16/17 0603  TempSrc: Oral   Pain Goal: Patients Stated Pain Goal: 3 (06/16/17 0603)               [name]

## 2017-06-16 NOTE — Anesthesia Preprocedure Evaluation (Addendum)
Anesthesia Evaluation  Patient identified by MRN, date of birth, ID band Patient awake    Reviewed: Allergy & Precautions, NPO status , Patient's Chart, lab work & pertinent test results  History of Anesthesia Complications (+) PONV and history of anesthetic complications  Airway Mallampati: II  TM Distance: >3 FB Neck ROM: Full    Dental no notable dental hx.    Pulmonary neg pulmonary ROS, former smoker,    Pulmonary exam normal breath sounds clear to auscultation       Cardiovascular hypertension, + angina Normal cardiovascular exam+ dysrhythmias  Rhythm:Regular Rate:Normal     Neuro/Psych negative neurological ROS  negative psych ROS   GI/Hepatic Neg liver ROS, GERD  ,  Endo/Other  negative endocrine ROS  Renal/GU negative Renal ROS  negative genitourinary   Musculoskeletal negative musculoskeletal ROS (+)   Abdominal   Peds negative pediatric ROS (+)  Hematology negative hematology ROS (+)   Anesthesia Other Findings   Reproductive/Obstetrics negative OB ROS                             Anesthesia Physical Anesthesia Plan  ASA: III  Anesthesia Plan: General   Post-op Pain Management:    Induction: Intravenous  PONV Risk Score and Plan: 4 or greater and Ondansetron, Dexamethasone, Midazolam, Scopolamine patch - Pre-op and Metaclopromide  Airway Management Planned: LMA  Additional Equipment:   Intra-op Plan:   Post-operative Plan: Extubation in OR  Informed Consent: I have reviewed the patients History and Physical, chart, labs and discussed the procedure including the risks, benefits and alternatives for the proposed anesthesia with the patient or authorized representative who has indicated his/her understanding and acceptance.   Dental advisory given  Plan Discussed with: CRNA  Anesthesia Plan Comments:         Anesthesia Quick Evaluation

## 2017-06-16 NOTE — Anesthesia Procedure Notes (Signed)
Procedure Name: LMA Insertion Date/Time: 06/16/2017 7:55 AM Performed by: Suella Grove Pre-anesthesia Checklist: Patient identified, Emergency Drugs available, Suction available and Patient being monitored Patient Re-evaluated:Patient Re-evaluated prior to induction Oxygen Delivery Method: Circle system utilized and Simple face mask Preoxygenation: Pre-oxygenation with 100% oxygen Induction Type: IV induction Ventilation: Mask ventilation without difficulty LMA: LMA inserted LMA Size: 4.0 Grade View: Grade II Tube type: Oral Placement Confirmation: positive ETCO2 and breath sounds checked- equal and bilateral Dental Injury: Teeth and Oropharynx as per pre-operative assessment

## 2017-06-16 NOTE — Transfer of Care (Signed)
Immediate Anesthesia Transfer of Care Note  Patient: Julie Gonzales  Procedure(s) Performed: DILATATION AND CURETTAGE /HYSTEROSCOPY (N/A )  Patient Location: PACU  Anesthesia Type:General  Level of Consciousness: awake, oriented and patient cooperative  Airway & Oxygen Therapy: Patient Spontanous Breathing and Patient connected to nasal cannula oxygen  Post-op Assessment: Report given to RN and Post -op Vital signs reviewed and stable  Post vital signs: Reviewed and stable  Last Vitals:  Vitals:   06/16/17 0603  Pulse: 60  Resp: 18  Temp: 36.8 C  SpO2: 98%    Last Pain:  Vitals:   06/16/17 0603  TempSrc: Oral      Patients Stated Pain Goal: 3 (06/16/17 0603)  Complications: No apparent anesthesia complications and Patient re-intubated

## 2017-06-16 NOTE — Op Note (Signed)
NAMEAYANAH, SNADER               ACCOUNT NO.:  [id number]  MEDICAL RECORD NO.:  [id number]  LOCATION:  [facility]                          FACILITY:  [facility]  PHYSICIAN:  Zelphia Cairo, MD    DATE OF BIRTH:  1951/02/03  DATE OF PROCEDURE:  06/16/2017 DATE OF DISCHARGE:  06/16/2017                              OPERATIVE REPORT   PREOPERATIVE DIAGNOSIS:  Thickened endometrium.  POSTOPERATIVE DIAGNOSIS:  Thickened endometrium.  PROCEDURE:  Hysteroscopy, D and C.  SURGEON:  Zelphia Cairo, MD  ANESTHESIA:  General.  SPECIMEN:  Endometrial curettings.  COMPLICATIONS:  Uterine perforation.  DESCRIPTION OF PROCEDURE:  The patient was taken to the operating room where anesthesia was obtained.  She was placed in the dorsal lithotomy position using Allen stirrups, prepped and draped in sterile fashion, and in-and-out catheter was used to drain her bladder.  Bivalve speculum was placed in the vagina and 1% lidocaine was placed in the anterior lip of the cervix.  Single-tooth tenaculum was attached.  The remaining 9 mL of lidocaine was used to provide a paracervical block.  The cervix was serially dilated using Pratt dilators and the hysteroscope was inserted into the endometrial cavity.  Fluffy endometrial tissue was noted and the hysteroscope was removed.  A gentle curetting was performed. Specimen was placed on Telfa and passed off to be sent to Pathology. The hysteroscope was reinserted and a small perforation was noted at the fundal portion of the uterus.  No bleeding was identified.  Hysteroscope was removed.  Tenaculum was removed.  The cervix was hemostatic. Speculum was removed.   Dictation ended at this point.     Zelphia Cairo, MD     GA/MEDQ  D:  06/16/2017  T:  06/16/2017  Job:  [id number]

## 2017-06-16 NOTE — Discharge Instructions (Signed)
[facility] office 2-3 days for postop appointment.  Call the office [phone] for an appointment.  Personal Hygiene: Use pads not tampons x 1week You may shower, no tub baths or pools for 2-3 weeks Wipe from front to back when using restroom  Activity: Do not drive or operate any equipment for 24 hrs.   Do not rest in bed all day Walking is encouraged Walk up and down stairs slowly You may return to your normal activity in 1-2 days  Sexual Activity:  No intercourse for 2 weeks after the procedure.  Diet: Eat a light meal as desired this evening.  You may resume your usual diet tomorrow.  Return to work:  You may resume your work activities after 1-2 days  What to expect:  Expect to have vaginal bleeding/discharge for 2-3 days and spotting for 10-14 days.  It is not unusual to have soreness for 1-2 weeks.  You may have a slight burning sensation when you urinate for the first few days.  You may start your menses in 2-6 weeks.  Mild cramps may continue for a couple of days.    Call your doctor:   Excessive bleeding, saturating a pad every hour Inability to urinate 6 hours after discharge Pain not relieved with pain medications Fever of 100.4 or greater

## 2017-06-17 ENCOUNTER — Encounter (HOSPITAL_COMMUNITY): Payer: Self-pay | Admitting: Obstetrics and Gynecology

## 2018-01-09 ENCOUNTER — Other Ambulatory Visit: Payer: Self-pay | Admitting: Physician Assistant

## 2018-01-09 DIAGNOSIS — Z1231 Encounter for screening mammogram for malignant neoplasm of breast: Secondary | ICD-10-CM

## 2018-01-31 ENCOUNTER — Ambulatory Visit: Payer: Medicare Other

## 2018-02-22 ENCOUNTER — Ambulatory Visit
Admission: RE | Admit: 2018-02-22 | Discharge: 2018-02-22 | Disposition: A | Discharge status: Home / Self Care | Payer: Medicare Other | Source: Ambulatory Visit | Attending: Physician Assistant | Admitting: Physician Assistant

## 2018-02-22 DIAGNOSIS — Z1231 Encounter for screening mammogram for malignant neoplasm of breast: Secondary | ICD-10-CM

## 2018-04-09 ENCOUNTER — Telehealth: Payer: Self-pay

## 2018-04-09 NOTE — Telephone Encounter (Signed)
Primary Cardiologist [facility] Pre-operative Risk Assessment    Request for surgical clearance:  1. What type of surgery is being performed? Left Knee Arthroscopy   2. When is this surgery scheduled? 05/04/18   3. What type of clearance is required (medical clearance vs. Pharmacy clearance to hold med vs. Both)? Medical  4. Are there any medications that need to be held prior to surgery and how long? N/A  5. Practice name and name of physician performing surgery? [facility]/ Dr.Norris  6. What is your office phone number    7.   What is your office fax number [phone] Attn: Santiago Bur  8.   Anesthesia type (None, local, MAC, general) ? Choice   Lamar Laundry 04/09/2018, 2:46 PM  _________________________________________________________________   (provider comments below)

## 2018-04-11 NOTE — Telephone Encounter (Signed)
   Primary Cardiologist: Croitoru Chart reviewed as part of pre-operative protocol coverage. Because of Marcelyn BruinsMichelle I Orndoff's past medical history and time since last visit (December 2016), he/she will require a follow-up visit in order to better assess preoperative cardiovascular risk.  Pre-op covering staff: - Please schedule appointment and call patient to inform them. - Please contact requesting surgeon's office via preferred method (i.e, phone, fax) to inform them of need for appointment prior to surgery.  Norma FredricksonLORI Darcia Lampi, NP  04/11/2018, 4:28 PM

## 2018-04-12 NOTE — Telephone Encounter (Signed)
I have scheduled the pt to see Corine ShelterLuke Kilroy PA on 04/18/18 I left a message on pt's voicemail notifying her appointment. I told her to give us a call to let us know if the appointment time and day works for her.

## 2018-04-18 ENCOUNTER — Ambulatory Visit: Payer: Medicare Other | Admitting: Cardiology

## 2018-04-18 ENCOUNTER — Encounter: Payer: Self-pay | Admitting: Cardiology

## 2018-04-18 VITALS — BP 122/60 | HR 57 | Ht 63.5 in | Wt 181.8 lb

## 2018-04-18 DIAGNOSIS — R9431 Abnormal electrocardiogram [ECG] [EKG]: Secondary | ICD-10-CM

## 2018-04-18 DIAGNOSIS — I447 Left bundle-branch block, unspecified: Secondary | ICD-10-CM

## 2018-04-18 DIAGNOSIS — R079 Chest pain, unspecified: Secondary | ICD-10-CM | POA: Diagnosis not present

## 2018-04-18 DIAGNOSIS — R7303 Prediabetes: Secondary | ICD-10-CM | POA: Insufficient documentation

## 2018-04-18 DIAGNOSIS — Z01810 Encounter for preprocedural cardiovascular examination: Secondary | ICD-10-CM | POA: Diagnosis not present

## 2018-04-18 DIAGNOSIS — I10 Essential (primary) hypertension: Secondary | ICD-10-CM | POA: Diagnosis not present

## 2018-04-18 DIAGNOSIS — E785 Hyperlipidemia, unspecified: Secondary | ICD-10-CM

## 2018-04-18 NOTE — Assessment & Plan Note (Signed)
Controlled on medication

## 2018-04-18 NOTE — Assessment & Plan Note (Signed)
On statin Rx- followed by PCP 

## 2018-04-18 NOTE — Patient Instructions (Addendum)
Medication Instructions:  Your physician recommends that you continue on your current medications as directed. Please refer to the Current Medication list given to you today. If you need a refill on your cardiac medications before your next appointment, please call your pharmacy.  Labwork: None-see below  Testing/Procedures: Coronary CT-see instructions below  Follow-Up: Follow up on a as needed basis pending the Coronary CT results   Any Other Special Instructions Will Be Listed Below (If Applicable).       INSTRUCTIONS FOR CORONARY CTA [facility] at (30-45 minutes prior to test start time)  [facility] [address] [place], [place] [address] [phone]  Proceed to the [facility] Department (First Floor).  Please follow these instructions carefully (unless otherwise directed): PLEASE HAVE LABS - BMP  AT LEAST ONE WEEK PRIOR TO TEST  On the Night Before the Test: Drink plenty of water. Do not consume any caffeinated/decaffeinated beverages or chocolate 12 hours prior to your test. Do not take any antihistamines 12 hours prior to your test.  On the Day of the Test: Drink plenty of water. Do not drink any water within one hour of the test. Do not eat any food 4 hours prior to the test. You may take your regular medications prior to the test. Take your Atenolol-Chlorthialidone 1 hour prior to your test.  After the Test: Drink plenty of water. After receiving IV contrast, you may experience a mild flushed feeling. This is normal. On occasion, you may experience a mild rash up to 24 hours after the test. This is not dangerous. If this occurs, you can take Benadryl 25 mg and increase your fluid intake. If you experience trouble breathing, this can be serious. If it is severe call 911 IMMEDIATELY. If it is mild, please call our office.

## 2018-04-18 NOTE — Assessment & Plan Note (Signed)
Pt seen today for pre op clearance prior to Lt knee arthroscopic surgery

## 2018-04-18 NOTE — Progress Notes (Signed)
04/18/2018 Julie Gonzales   01/24/1951  [id number]  Primary Physician Deliah BostonLouis, Rachel, PA-C Primary Cardiologist: Dr Royann Shiversroitoru  HPI:  67 y/o Caucasian female with a history of exertional chest pain and multiple cardiac risk factors. She was seen by Dr Royann Shiversroitoru in Nov 2016 and had a low risk Myoview and normal LVF by echo. She has a chronic LBBB. She has not been seen here since. She needs to have Lt knee surgery. She tells me today in the office that she continues to have exertional chest "tightness" after walking a block. She stops and it gets better. She says there is some radiation to her neck. She denies nausea, vomiting, or diaphoresis. Her symptoms are stable, not progressing and no rest symptoms.    Current Outpatient Medications  Medication Sig Dispense Refill  . acetaminophen (TYLENOL) 500 MG tablet Take 500 mg by mouth every 6 (six) hours as needed (for pain.).    Marland Kitchen. atenolol-chlorthalidone (TENORETIC) 50-25 MG tablet Take 1 tablet by mouth every evening.     . Cholecalciferol (VITAMIN D3) 2000 UNITS TABS Take 2,000 Units by mouth every evening.     Marland Kitchen. losartan (COZAAR) 25 MG tablet Take 50 mg by mouth every evening.  1  . Melatonin 10 MG TABS Take 10 mg by mouth at bedtime.    Marland Kitchen. PARoxetine (PAXIL) 20 MG tablet Take 20 mg by mouth every evening.    . potassium chloride (MICRO-K) 10 MEQ CR capsule Take 10 mEq by mouth every evening.  1  . pravastatin (PRAVACHOL) 40 MG tablet Take 40 mg by mouth every evening.     . Probiotic Product (PROBIOTIC PO) Take 2 capsules by mouth every evening.     No current facility-administered medications for this visit.     Allergies  Allergen Reactions  . Amlodipine Other (See Comments)    Edema  . Ramipril Cough  . Ciprofloxacin     IV dose causes patient to hold her breath    Past Medical History:  Diagnosis Date  . Anxiety   . Arthritis   . Dyspnea    with exertion  . Essential hypertension 07/14/2015  . GERD (gastroesophageal  reflux disease)    "silent"  . Hyperlipidemia 07/14/2015  . LBBB (left bundle branch block) 07/14/2015  . PONV (postoperative nausea and vomiting)    prolonged sedation    Social History   Socioeconomic History  . Marital status: Married    Spouse name: Not on file  . Number of children: Not on file  . Years of education: Not on file  . Highest education level: Not on file  Occupational History  . Not on file  Social Needs  . Financial resource strain: Not on file  . Food insecurity:    Worry: Not on file    Inability: Not on file  . Transportation needs:    Medical: Not on file    Non-medical: Not on file  Tobacco Use  . Smoking status: Former Games developermoker  . Smokeless tobacco: Never Used  Substance and Sexual Activity  . Alcohol use: Yes  . Drug use: No  . Sexual activity: Not on file  Lifestyle  . Physical activity:    Days per week: Not on file    Minutes per session: Not on file  . Stress: Not on file  Relationships  . Social connections:    Talks on phone: Not on file    Gets together: Not on file    Attends  religious service: Not on file    Active member of club or organization: Not on file    Attends meetings of clubs or organizations: Not on file    Relationship status: Not on file  . Intimate partner violence:    Fear of current or ex partner: Not on file    Emotionally abused: Not on file    Physically abused: Not on file    Forced sexual activity: Not on file  Other Topics Concern  . Not on file  Social History Narrative  . Not on file     Family History  Problem Relation Age of Onset  . Diabetes Mother   . Hypertension Mother   . Lung cancer Father   . Breast cancer Neg Hx      Review of Systems: General: negative for chills, fever, night sweats or weight changes.  Cardiovascular: negative for dyspnea on exertion, edema, orthopnea, palpitations, paroxysmal nocturnal dyspnea or shortness of breath Dermatological: negative for rash Respiratory:  negative for cough or wheezing Urologic: negative for hematuria Abdominal: negative for nausea, vomiting, diarrhea, bright red blood per rectum, melena, or hematemesis Neurologic: negative for visual changes, syncope, or dizziness All other systems reviewed and are otherwise negative except as noted above.    Blood pressure 122/60, pulse (!) 57, height 5' 3.5" (1.613 m), weight 181 lb 12.8 oz (82.5 kg).  General appearance: alert, cooperative and no distress Neck: no carotid bruit and no JVD Lungs: clear to auscultation bilaterally Heart: regular rate and rhythm Extremities: extremities normal, atraumatic, no cyanosis or edema Skin: Skin color, texture, turgor normal. No rashes or lesions Neurologic: Grossly normal  EKG NSR, SB, LBBB  ASSESSMENT AND PLAN:   Pre-operative cardiovascular examination Pt seen today for pre op clearance prior to Lt knee arthroscopic surgery  Exertional chest pain Moderate risk for coronary etiology.  Essential hypertension Controlled on medication  Dyslipidemia On statin Rx followed by PCP  LBBB (left bundle branch block) Chronic  Pre-diabetes On diet   PLAN  Discussed with Dr Antoine PocheHochrein in the office today-coronary CTA before clearance for surgery is granted. I will fax a copy of the [facility] to [facility]  Jetta Murray PA-C 04/18/2018 10:48 AM

## 2018-04-18 NOTE — Assessment & Plan Note (Signed)
Chronic. 

## 2018-04-18 NOTE — Assessment & Plan Note (Signed)
Moderate risk for coronary etiology.

## 2018-04-18 NOTE — Assessment & Plan Note (Signed)
  On diet  

## 2018-04-19 ENCOUNTER — Telehealth: Payer: Self-pay

## 2018-04-19 ENCOUNTER — Inpatient Hospital Stay (HOSPITAL_COMMUNITY): Admission: RE | Admit: 2018-04-19 | Payer: Medicare Other | Source: Ambulatory Visit

## 2018-04-19 NOTE — Telephone Encounter (Signed)
Per Franky MachoLuke Kilroy's note from yesterday, he saw patient in the office and is awaiting CT coronary. I will remove this from the preop box.

## 2018-04-19 NOTE — Telephone Encounter (Signed)
   [facility] Pre-operative Risk Assessment    Request for surgical clearance:  1. What type of surgery is being performed? Left Knee arthroscopy   2. When is this surgery scheduled? Waiting for clearance, patient needs CTA   3. What type of clearance is required (medical clearance vs. Pharmacy clearance to hold med vs. Both)? Medical  4. Are there any medications that need to be held prior to surgery and how long? none   5. Practice name and name of physician performing surgery? [facility], Dr. Veverly Fells  6. What is your office phone number [phone]    7.   What is your office fax number [phone] Attn: [name]  8.   Anesthesia type (None, local, MAC, general) ? choice   Julie Gonzales 04/19/2018, 4:26 PM  ___________________________________________________

## 2018-04-20 ENCOUNTER — Emergency Department (HOSPITAL_COMMUNITY): Payer: Medicare Other

## 2018-04-20 ENCOUNTER — Other Ambulatory Visit: Payer: Self-pay

## 2018-04-20 ENCOUNTER — Observation Stay (HOSPITAL_COMMUNITY)
Admission: EM | Admit: 2018-04-20 | Discharge: 2018-04-21 | Disposition: A | Discharge status: Home / Self Care | Payer: Medicare Other | Attending: Family Medicine | Admitting: Family Medicine

## 2018-04-20 ENCOUNTER — Encounter (HOSPITAL_COMMUNITY): Payer: Self-pay | Admitting: Emergency Medicine

## 2018-04-20 DIAGNOSIS — R072 Precordial pain: Secondary | ICD-10-CM | POA: Diagnosis not present

## 2018-04-20 DIAGNOSIS — Z87891 Personal history of nicotine dependence: Secondary | ICD-10-CM | POA: Insufficient documentation

## 2018-04-20 DIAGNOSIS — R079 Chest pain, unspecified: Secondary | ICD-10-CM | POA: Diagnosis present

## 2018-04-20 DIAGNOSIS — R0602 Shortness of breath: Principal | ICD-10-CM | POA: Insufficient documentation

## 2018-04-20 DIAGNOSIS — M546 Pain in thoracic spine: Secondary | ICD-10-CM | POA: Diagnosis not present

## 2018-04-20 DIAGNOSIS — I10 Essential (primary) hypertension: Secondary | ICD-10-CM | POA: Diagnosis present

## 2018-04-20 DIAGNOSIS — Z79899 Other long term (current) drug therapy: Secondary | ICD-10-CM | POA: Insufficient documentation

## 2018-04-20 DIAGNOSIS — E785 Hyperlipidemia, unspecified: Secondary | ICD-10-CM | POA: Diagnosis present

## 2018-04-20 LAB — CBC
HEMATOCRIT: 41.9 % (ref 36.0–46.0)
HEMOGLOBIN: 13.8 g/dL (ref 12.0–15.0)
MCH: 31.9 pg (ref 26.0–34.0)
MCHC: 32.9 g/dL (ref 30.0–36.0)
MCV: 96.8 fL (ref 78.0–100.0)
Platelets: 240 10*3/uL (ref 150–400)
RBC: 4.33 MIL/uL (ref 3.87–5.11)
RDW: 12.2 % (ref 11.5–15.5)
WBC: 8 10*3/uL (ref 4.0–10.5)

## 2018-04-20 LAB — BASIC METABOLIC PANEL
ANION GAP: 13 (ref 5–15)
BUN: 15 mg/dL (ref 8–23)
CALCIUM: 9.4 mg/dL (ref 8.9–10.3)
CHLORIDE: 102 mmol/L (ref 98–111)
CO2: 27 mmol/L (ref 22–32)
Creatinine, Ser: 1.19 mg/dL — ABNORMAL HIGH (ref 0.44–1.00)
GFR calc non Af Amer: 46 mL/min — ABNORMAL LOW (ref >60)
GFR, EST AFRICAN AMERICAN: 54 mL/min — AB (ref >60)
Glucose, Bld: 135 mg/dL — ABNORMAL HIGH (ref 70–99)
POTASSIUM: 3.4 mmol/L — AB (ref 3.5–5.1)
Sodium: 142 mmol/L (ref 135–145)

## 2018-04-20 LAB — I-STAT TROPONIN, ED: TROPONIN I, POC: 0.01 ng/mL (ref 0.00–0.08)

## 2018-04-20 MED ORDER — MORPHINE SULFATE (PF) 4 MG/ML IV SOLN
4.0000 mg | Freq: Once | INTRAVENOUS | Status: AC
  Administered 2018-04-21: 4 mg via INTRAVENOUS
  Filled 2018-04-20: qty 1

## 2018-04-20 MED ORDER — ASPIRIN 81 MG PO CHEW
324.0000 mg | CHEWABLE_TABLET | Freq: Once | ORAL | Status: AC
Start: 2018-04-20 — End: 2018-04-20
  Administered 2018-04-20: 324 mg via ORAL
  Filled 2018-04-20: qty 4

## 2018-04-20 MED ORDER — NITROGLYCERIN 0.4 MG SL SUBL
0.4000 mg | SUBLINGUAL_TABLET | SUBLINGUAL | Status: DC | PRN
  Administered 2018-04-20: 0.4 mg via SUBLINGUAL
  Filled 2018-04-20: qty 1

## 2018-04-20 NOTE — ED Provider Notes (Signed)
[facility] EMERGENCY DEPARTMENT Provider Note   CSN: [id number] Arrival date & time: 04/20/18  1958     History   Chief Complaint Chief Complaint  Patient presents with  . Shoulder Pain  . Chest Pain    HPI Julie Gonzales is a 67 y.o. female.  The history is provided by the patient and medical records.  Chest Pain   This is a new problem. The current episode started more than 2 days ago. The problem occurs daily. The problem has been gradually worsening. The pain is associated with exertion. The pain is present in the substernal region. The pain is at a severity of 10/10. The pain is severe. The quality of the pain is described as exertional, heavy, pressure-like and dull. The pain radiates to the left neck, left shoulder and left jaw. The symptoms are aggravated by exertion. Associated symptoms include exertional chest pressure, headaches, nausea and shortness of breath. Pertinent negatives include no abdominal pain, no back pain, no cough, no diaphoresis, no dizziness, no fever, no irregular heartbeat, no lower extremity edema, no malaise/fatigue, no palpitations, no sputum production, no vomiting and no weakness. She has tried nothing for the symptoms. The treatment provided no relief.    Past Medical History:  Diagnosis Date  . Anxiety   . Arthritis   . Dyspnea    with exertion  . Essential hypertension 07/14/2015  . GERD (gastroesophageal reflux disease)    "silent"  . Hyperlipidemia 07/14/2015  . LBBB (left bundle branch block) 07/14/2015  . PONV (postoperative nausea and vomiting)    prolonged sedation    Patient Active Problem List   Diagnosis Date Noted  . Pre-operative cardiovascular examination 04/18/2018  . Pre-diabetes 04/18/2018  . Essential hypertension 07/14/2015  . Exertional chest pain 07/14/2015  . Exertional dyspnea 07/14/2015  . LBBB (left bundle branch block) 07/14/2015  . Dyslipidemia 07/14/2015    Past Surgical History:    Procedure Laterality Date  . ANUS SURGERY    . BREAST SURGERY Left    benign cyst removal  . CESAREAN SECTION    . CHOLECYSTECTOMY    . COLONOSCOPY    . DEBRIDEMENT LEG Right   . HYSTEROSCOPY W/D&C N/A 06/16/2017   Procedure: DILATATION AND CURETTAGE /HYSTEROSCOPY;  Surgeon: Zelphia Cairo, MD;  Location: [facility];  Service: Gynecology;  Laterality: N/A;  . nuclear medicine stress test    . UPPER GI ENDOSCOPY       OB History   None      Home Medications    Prior to Admission medications   Medication Sig Start Date End Date Taking? Authorizing Provider  acetaminophen (TYLENOL) 500 MG tablet Take 500 mg by mouth every 6 (six) hours as needed (for pain.).    [provider]  atenolol-chlorthalidone (TENORETIC) 50-25 MG tablet Take 1 tablet by mouth every evening.  04/30/15   [provider]  Cholecalciferol (VITAMIN D3) 2000 UNITS TABS Take 2,000 Units by mouth every evening.     [provider]  losartan (COZAAR) 25 MG tablet Take 50 mg by mouth every evening. 01/04/18   [provider]  Melatonin 10 MG TABS Take 10 mg by mouth at bedtime.    [provider]  PARoxetine (PAXIL) 20 MG tablet Take 20 mg by mouth every evening.    [provider]  potassium chloride (MICRO-K) 10 MEQ CR capsule Take 10 mEq by mouth every evening. 03/19/18   [provider]  pravastatin (PRAVACHOL) 40  MG tablet Take 40 mg by mouth every evening.  07/09/15   [provider]  Probiotic Product (PROBIOTIC PO) Take 2 capsules by mouth every evening.    [provider]    Family History Family History  Problem Relation Age of Onset  . Diabetes Mother   . Hypertension Mother   . Lung cancer Father   . Breast cancer Neg Hx     Social History Social History   Tobacco Use  . Smoking status: Former Games developermoker  . Smokeless tobacco: Never Used  Substance Use Topics  . Alcohol use: Yes  . Drug use: No     Allergies    Amlodipine; Ramipril; and Ciprofloxacin   Review of Systems Review of Systems  Constitutional: Negative for chills, diaphoresis, fatigue, fever and malaise/fatigue.  HENT: Negative for congestion.   Eyes: Negative for visual disturbance.  Respiratory: Positive for chest tightness and shortness of breath. Negative for cough, sputum production, wheezing and stridor.   Cardiovascular: Positive for chest pain. Negative for palpitations and leg swelling.  Gastrointestinal: Positive for nausea. Negative for abdominal pain, constipation, diarrhea and vomiting.  Genitourinary: Negative for dysuria and frequency.  Musculoskeletal: Negative for back pain, neck pain and neck stiffness.  Skin: Negative for rash and wound.  Neurological: Positive for headaches. Negative for dizziness, facial asymmetry, weakness and light-headedness.  Psychiatric/Behavioral: Negative for agitation.  All other systems reviewed and are negative.    Physical Exam Updated Vital Signs BP (!) 162/57 (BP Location: Left Arm)   Pulse 72   Temp 98.4 F (36.9 C) (Oral)   Resp 17   SpO2 95%   Physical Exam  Constitutional: She is oriented to person, place, and time. She appears well-developed and well-nourished. No distress.  HENT:  Head: Normocephalic and atraumatic.  Nose: Nose normal.  Mouth/Throat: Oropharynx is clear and moist. No oropharyngeal exudate.  Eyes: Pupils are equal, round, and reactive to light. Conjunctivae and EOM are normal.  Neck: Neck supple.  Cardiovascular: Normal rate and regular rhythm. Exam reveals no gallop.  No murmur heard. Pulmonary/Chest: Effort normal and breath sounds normal. No respiratory distress. She has no wheezes. She has no rales. She exhibits no tenderness.  Abdominal: Soft. She exhibits no distension. There is no tenderness.  Musculoskeletal: She exhibits no edema or tenderness.  Neurological: She is alert and oriented to person, place, and time. No sensory deficit. She  exhibits normal muscle tone.  Skin: Skin is warm and dry. No rash noted. She is not diaphoretic. No erythema.  Psychiatric: She has a normal mood and affect.  Nursing note and vitals reviewed.    ED Treatments / Results  Labs (all labs ordered are listed, but only abnormal results are displayed) Labs Reviewed  BASIC METABOLIC PANEL - Abnormal; Notable for the following components:      Result Value   Potassium 3.4 (*)    Glucose, Bld 135 (*)    Creatinine, Ser 1.19 (*)    GFR calc non Af Amer 46 (*)    GFR calc Af Amer 54 (*)    All other components within normal limits  CBC  TROPONIN I  TROPONIN I  TROPONIN I  I-STAT TROPONIN, ED    EKG EKG Interpretation  Date/Time:  Friday April 20 2018 20:13:33 EDT Ventricular Rate:  72 PR Interval:  152 QRS Duration: 140 QT Interval:  428 QTC Calculation: 468 R Axis:   116 Text Interpretation:  Normal sinus rhythm Right axis deviation Non-specific intra-ventricular conduction  block Cannot rule out Anteroseptal infarct , age undetermined T wave abnormality , with new inversion in III Abnormal ekg Confirmed by Gerhard Munch [phone]) on 04/20/2018 8:15:41 PM   Radiology Dg Chest 2 View  Result Date: 04/20/2018 CLINICAL DATA:  Patient with sharp left-sided chest pain. EXAM: CHEST - 2 VIEW COMPARISON:  None. FINDINGS: Monitoring leads overlie the patient. Normal cardiac and mediastinal contours. No consolidative pulmonary opacities. No pleural effusion or pneumothorax. IMPRESSION: No acute cardiopulmonary process. Electronically Signed   By: Annia Belt M.D.   On: 04/20/2018 21:07    Procedures Procedures (including critical care time)  Medications Ordered in ED Medications  nitroGLYCERIN (NITROSTAT) SL tablet 0.4 mg (0.4 mg Sublingual Given 04/20/18 2304)  morphine 4 MG/ML injection 4 mg (has no administration in time range)  pravastatin (PRAVACHOL) tablet 40 mg (has no administration in time range)  cholecalciferol (VITAMIN D)  tablet 2,000 Units (has no administration in time range)  PARoxetine (PAXIL) tablet 20 mg (has no administration in time range)  losartan (COZAAR) tablet 50 mg (has no administration in time range)  Melatonin TABS 10 mg (has no administration in time range)  acetaminophen (TYLENOL) tablet 650 mg (has no administration in time range)  ondansetron (ZOFRAN) injection 4 mg (has no administration in time range)  aspirin EC tablet 325 mg (has no administration in time range)  morphine 2 MG/ML injection 2 mg (has no administration in time range)  atenolol (TENORMIN) tablet 50 mg (has no administration in time range)    And  chlorthalidone (HYGROTON) tablet 25 mg (has no administration in time range)  aspirin chewable tablet 324 mg (324 mg Oral Given 04/20/18 2304)     Initial Impression / Assessment and Plan / ED Course  I have reviewed the triage vital signs and the nursing notes.  Pertinent labs & imaging results that were available during my care of the patient were reviewed by me and considered in my medical decision making (see chart for details).     Julie Gonzales is a 67 y.o. female with a past medical history significant for hypertension, hyperlipidemia, and known left bundle branch block who presents with chest pain.  Patient says that over the last few weeks she has had exertional chest pain and shortness of breath.  She reports that he had discomfort in her central chest that radiates up to her neck and jaw.  She also says she has had pain today that goes to her left shoulder.  She was supposed to have a knee surgery coming up and when she was seen by cardiology for clearance, they were concerned about her exertional shortness of breath and was ordering a coronary CT.  She has not had that yet.  She says that her symptoms of worsened probably her to come in.  She says if she tries to walk around she gets very short of breath and has the chest pain.  She does report nausea but no vomiting.   She denies diaphoresis, palpitations or current lightheadedness.  She denies any fevers, chills.  She denies significant leg pain but reports some mild bilateral leg edema.  She denies other complaints on arrival.  On exam, patient's lungs were clear.  Chest is nontender.  I could not reproduce her discomfort.  Shoulder was nontender.  Abdomen was nontender.  Patient had very mild edema in her legs.  Symmetric pulses in upper and lower extremities.  Initial EKG showed a left bundle branch block with no STEMI.  Patient did have a new T wave inversion.  Heart score calculated as a 6.  Laboratory testing showed negative troponin, chest x-ray was reassuring and other laboratory testing did not show significant ab normalities.  Next  Patient was given aspirin and nitroglycerin.  Nitro helped her pain slightly and it is now less than 5 out of 10 in severity.  She reports he got up to a 10 out of 10 in severity today.  Given the cardiology concern for a cardiac etiology of her chest pain and shortness of breath as well as her continued symptoms with elevated heart score, we do not feel safe with her going home.    Medicine team will be called for admission for chest pain rule out.  Patient was given morphine for further chest pain management.  12:12 AM Cardiology was called and will see the patient in consultation.   Final Clinical Impressions(s) / ED Diagnoses   Final diagnoses:  Exertional shortness of breath  Precordial pain    ED Discharge Orders    None      Clinical Impression: 1. Exertional shortness of breath   2. Precordial pain     Disposition: Admit  This note was prepared with assistance of Dragon voice recognition software. Occasional wrong-word or sound-a-like substitutions may have occurred due to the inherent limitations of voice recognition software.     Khaden Gater, Canary Brimhristopher J, MD 04/21/18 Moses [email or web address]

## 2018-04-20 NOTE — ED Triage Notes (Signed)
C/o constant sharp L shoulder blade pain yesterday.  Today pain has been intermittent.  Also reports 10-15 sec discomfort to center of chest while driving to hospital.  SOB with exertion and headache.   Denies nausea, vomiting, and dizziness.

## 2018-04-20 NOTE — ED Notes (Signed)
Patient transported to X-ray 

## 2018-04-21 ENCOUNTER — Other Ambulatory Visit: Payer: Self-pay

## 2018-04-21 ENCOUNTER — Encounter (HOSPITAL_COMMUNITY): Payer: Self-pay | Admitting: *Deleted

## 2018-04-21 DIAGNOSIS — M546 Pain in thoracic spine: Secondary | ICD-10-CM

## 2018-04-21 DIAGNOSIS — R079 Chest pain, unspecified: Secondary | ICD-10-CM

## 2018-04-21 DIAGNOSIS — I10 Essential (primary) hypertension: Secondary | ICD-10-CM | POA: Diagnosis not present

## 2018-04-21 DIAGNOSIS — E785 Hyperlipidemia, unspecified: Secondary | ICD-10-CM

## 2018-04-21 LAB — BASIC METABOLIC PANEL
BUN/Creatinine Ratio: 16 (ref 12–28)
BUN: 16 mg/dL (ref 8–27)
CO2: 26 mmol/L (ref 20–29)
Calcium: 9.6 mg/dL (ref 8.7–10.3)
Chloride: 101 mmol/L (ref 96–106)
Creatinine, Ser: 0.99 mg/dL (ref 0.57–1.00)
GFR calc Af Amer: 68 mL/min/{1.73_m2} (ref >59)
GFR calc non Af Amer: 59 mL/min/{1.73_m2} — ABNORMAL LOW (ref >59)
Glucose: 91 mg/dL (ref 65–99)
Potassium: 4.1 mmol/L (ref 3.5–5.2)
Sodium: 143 mmol/L (ref 134–144)

## 2018-04-21 LAB — TROPONIN I
Troponin I: <0.03 ng/mL (ref <0.03)
Troponin I: <0.03 ng/mL (ref <0.03)

## 2018-04-21 MED ORDER — PRAVASTATIN SODIUM 40 MG PO TABS: 40.0000 mg | ORAL_TABLET | Freq: Every evening | ORAL | Status: DC

## 2018-04-21 MED ORDER — MORPHINE SULFATE (PF) 2 MG/ML IV SOLN: 2.0000 mg | INTRAVENOUS | Status: DC | PRN

## 2018-04-21 MED ORDER — ONDANSETRON HCL 4 MG/2ML IJ SOLN: 4.0000 mg | Freq: Four times a day (QID) | INTRAMUSCULAR | Status: DC | PRN

## 2018-04-21 MED ORDER — LOSARTAN POTASSIUM 50 MG PO TABS: 50.0000 mg | ORAL_TABLET | Freq: Every evening | ORAL | Status: DC

## 2018-04-21 MED ORDER — MELATONIN 3 MG PO TABS
9.0000 mg | ORAL_TABLET | Freq: Every day | ORAL | Status: DC
  Administered 2018-04-21: 9 mg via ORAL
  Filled 2018-04-21: qty 3

## 2018-04-21 MED ORDER — CHLORTHALIDONE 25 MG PO TABS
25.0000 mg | ORAL_TABLET | Freq: Every day | ORAL | Status: DC
  Administered 2018-04-21: 25 mg via ORAL
  Filled 2018-04-21: qty 1

## 2018-04-21 MED ORDER — ACETAMINOPHEN 325 MG PO TABS
650.0000 mg | ORAL_TABLET | ORAL | Status: DC | PRN
  Administered 2018-04-21 (×2): 650 mg via ORAL
  Filled 2018-04-21 (×2): qty 2

## 2018-04-21 MED ORDER — ASPIRIN EC 325 MG PO TBEC
325.0000 mg | DELAYED_RELEASE_TABLET | Freq: Every day | ORAL | Status: DC
  Administered 2018-04-21: 325 mg via ORAL
  Filled 2018-04-21: qty 1

## 2018-04-21 MED ORDER — VITAMIN D 1000 UNITS PO TABS: 2000.0000 [IU] | ORAL_TABLET | Freq: Every evening | ORAL | Status: DC

## 2018-04-21 MED ORDER — ACETAMINOPHEN 500 MG PO TABS: 500.0000 mg | ORAL_TABLET | Freq: Four times a day (QID) | ORAL | Status: DC | PRN

## 2018-04-21 MED ORDER — PAROXETINE HCL 20 MG PO TABS: 20.0000 mg | ORAL_TABLET | Freq: Every evening | ORAL | Status: DC

## 2018-04-21 MED ORDER — ATENOLOL-CHLORTHALIDONE 50-25 MG PO TABS: 1.0000 | ORAL_TABLET | Freq: Every evening | ORAL | Status: DC

## 2018-04-21 MED ORDER — ATENOLOL 25 MG PO TABS
50.0000 mg | ORAL_TABLET | Freq: Every day | ORAL | Status: DC
  Administered 2018-04-21: 50 mg via ORAL
  Filled 2018-04-21: qty 2

## 2018-04-21 NOTE — Discharge Summary (Signed)
Physician Discharge Summary  Julie BeamMichelle I Gonzales ZOX:[id number] DOB: 05/27/1951 DOA: 04/20/2018  PCP: Deliah BostonLouis, Rachel, PA-C  Admit date: 04/20/2018 Discharge date: 04/21/2018  Admitted From: Home  Disposition:  Home   Recommendations for Outpatient Follow-up:  1. Follow up with PCP in 1-2 weeks 2. Please follow up for CT coronary angiography on 8/20 3. Follow up with Cardiology after that 4. Please follow up blood pressure on current regimen and titrate up as needed   Home Health: None  Equipment/Devices: None  Discharge Condition: Good CODE STATUS: FULL Diet recommendation: Cardiac  Brief/Interim Summary: Mrs. Willaim Baneark is a [age] y.o. F with HTN poorly controlled, hx LBBB who presents with several seconds of chest pain today while driving/seated, unaccompanied by shortness of breath, radiation, nausea, sweats.  In addition she had some paraspinal left upper back pain days, nonexertional, not associated with nausea, vomiting, leukocytosis, which resolved in the ER with morphine.       Discharge Diagnoses:   Chest pain ECG notable for old left bundle.  Troponins serially negative.  Chest pain resolved and did not recur.  Evaluated by Cardiology who recommended follow up with CT coronary angiogram later this week and then outpatient Cardiology office follow up.  Of note, patient describes over 2 years exertional symptoms (discomfort radiating to jaw), stable, with moderate to heavy exertion, relieved with rest, since around 2016 when she had her normal Echo and nuclear medicine stress with Dr. Salena Saner.   Back pain Left upper back, paraspinal in location.  No radiculitis symptoms.  Completely resolved.  No pleuritic pain, dyspnea, doubt PE.  No nausea, vomiting, doubt referred gallbladder pain.   Essential hypertension Poorly controlled.  Restarted losartan.     Discharge Instructions  Discharge Instructions    Diet - low sodium heart healthy   Complete by:  As directed    Discharge  instructions   Complete by:  As directed    From Dr. Maryfrances Bunnellanford: You were admitted with chest pain. While you were here, we ruled out that you were having an active heart attack using your EKG (the electrical activity of your heart) and blood work.  Dr. Cristal Deerhristopher (Dr. Duwayne Heckroitorou's partner from Cardiology) and I agree that you should have the CT coronary angiogram ordered by Dr. Renaye Rakers's office this coming Tuesday.  I have sent a message to his office to follow up with you after that. It is important that you take your blood pressure medicines (listed on this sheet) as prescribed.   Increase activity slowly   Complete by:  As directed      Allergies as of 04/21/2018      Reactions   Amlodipine Other (See Comments)   Edema   Ramipril Cough   Ciprofloxacin    IV dose causes patient to hold her breath      Medication List    TAKE these medications   acetaminophen 500 MG tablet Commonly known as:  TYLENOL Take 500 mg by mouth every 6 (six) hours as needed for mild pain.   atenolol-chlorthalidone 50-25 MG tablet Commonly known as:  TENORETIC Take 1 tablet by mouth every evening.   losartan 25 MG tablet Commonly known as:  COZAAR Take 50 mg by mouth every evening.   Melatonin 10 MG Tabs Take 10 mg by mouth at bedtime.   PARoxetine 20 MG tablet Commonly known as:  PAXIL Take 20 mg by mouth every evening.   potassium chloride 10 MEQ CR capsule Commonly known as:  MICRO-K Take 10 mEq  by mouth every evening.   pravastatin 40 MG tablet Commonly known as:  PRAVACHOL Take 40 mg by mouth every evening.   PROBIOTIC PO Take 2 capsules by mouth every evening.   Vitamin D3 2000 units Tabs Take 2,000 Units by mouth every evening.      Follow-up Information    Croitoru, Mihai, MD. Go on 05/11/2018.   Specialty:  Cardiology Why:  @ 11:40am. Please make sure to go to your previously scheduled cardiac CT on 8/20 Contact information: [address] [place]  [id number] [phone]          Allergies  Allergen Reactions  . Amlodipine Other (See Comments)    Edema  . Ramipril Cough  . Ciprofloxacin     IV dose causes patient to hold her breath    Consultations:  Cardiology   Procedures/Studies: Dg Chest 2 View  Result Date: 04/20/2018 CLINICAL DATA:  Patient with sharp left-sided chest pain. EXAM: CHEST - 2 VIEW COMPARISON:  None. FINDINGS: Monitoring leads overlie the patient. Normal cardiac and mediastinal contours. No consolidative pulmonary opacities. No pleural effusion or pneumothorax. IMPRESSION: No acute cardiopulmonary process. Electronically Signed   By: Annia Beltrew  Davis M.D.   On: 04/20/2018 21:07      Subjective: Back pain gone.  Chest pain gone and has not recurred.  No exertional symptoms.  No nausea, vomiting, dyspnea, pleuritic pain.  Discharge Exam: Vitals:   04/21/18 0609 04/21/18 0733  BP: (!) 136/59 (!) 142/54  Pulse: (!) 59 (!) 50  Resp: 15 19  Temp: (!) 97.5 F (36.4 C) 97.6 F (36.4 C)  SpO2: 94% 93%   Vitals:   04/21/18 0015 04/21/18 0046 04/21/18 0609 04/21/18 0733  BP: (!) 140/50 (!) 146/63 (!) 136/59 (!) 142/54  Pulse: 67 67 (!) 59 (!) 50  Resp: 13 16 15 19   Temp:  98.5 F (36.9 C) (!) 97.5 F (36.4 C) 97.6 F (36.4 C)  TempSrc:  Oral Oral Oral  SpO2: 96% 96% 94% 93%  Weight:  82.6 kg 81.6 kg   Height:  5\' 3"  (1.6 m)      General: Pt is alert, awake, not in acute distress Cardiovascular: RRR, S1/S2 +, no rubs, no gallops Respiratory: CTA bilaterally, no wheezing, no rhonchi Abdominal: Soft, NT, ND, bowel sounds + Extremities: no edema, no cyanosis    The results of significant diagnostics from this hospitalization (including imaging, microbiology, ancillary and laboratory) are listed below for reference.     Microbiology: No results found for this or any previous visit (from the past 240 hour(s)).   Labs: BNP (last 3 results) No results for input(s): BNP in the last 8760  hours. Basic Metabolic Panel: Recent Labs  Lab 04/20/18 1604 04/20/18 2027  NA 143 142  K 4.1 3.4*  CL 101 102  CO2 26 27  GLUCOSE 91 135*  BUN 16 15  CREATININE 0.99 1.19*  CALCIUM 9.6 9.4   Liver Function Tests: No results for input(s): AST, ALT, ALKPHOS, BILITOT, PROT, ALBUMIN in the last 168 hours. No results for input(s): LIPASE, AMYLASE in the last 168 hours. No results for input(s): AMMONIA in the last 168 hours. CBC: Recent Labs  Lab 04/20/18 2027  WBC 8.0  HGB 13.8  HCT 41.9  MCV 96.8  PLT 240   Cardiac Enzymes: Recent Labs  Lab 04/21/18 0010 04/21/18 0317 04/21/18 0647  TROPONINI <0.03 <0.03 <0.03   BNP: Invalid input(s): POCBNP CBG: No results for input(s): GLUCAP in the  last 168 hours. D-Dimer No results for input(s): DDIMER in the last 72 hours. Hgb A1c No results for input(s): HGBA1C in the last 72 hours. Lipid Profile No results for input(s): CHOL, HDL, LDLCALC, TRIG, CHOLHDL, LDLDIRECT in the last 72 hours. Thyroid function studies No results for input(s): TSH, T4TOTAL, T3FREE, THYROIDAB in the last 72 hours.  Invalid input(s): FREET3 Anemia work up No results for input(s): VITAMINB12, FOLATE, FERRITIN, TIBC, IRON, RETICCTPCT in the last 72 hours. Urinalysis No results found for: COLORURINE, APPEARANCEUR, LABSPEC, PHURINE, GLUCOSEU, HGBUR, BILIRUBINUR, KETONESUR, PROTEINUR, UROBILINOGEN, NITRITE, LEUKOCYTESUR Sepsis Labs Invalid input(s): PROCALCITONIN,  WBC,  LACTICIDVEN Microbiology No results found for this or any previous visit (from the past 240 hour(s)).   Time coordinating discharge: 25 minutes       SIGNED:   Alberteen Sam, MD  [facility] 04/21/2018, 11:07 AM

## 2018-04-21 NOTE — Progress Notes (Signed)
Brief cardiology follow up note:  Patient seen and examined this AM. She is feeling much better. Her back pain is significantly improved. Troponins were negative x3. Telemetry unremarkable. We discussed options, and she is amenable to discharge home (or likely to her daughter's house, which is closer to the hospital) with planned prior coronary CTA scheduled for 04/24/18.  Recommend outpatient CTA as previously scheduled, will need follow up with Dr. Royann Shiversroitoru as an outpatient.  Julie RedBridgette Yajaira Doffing, MD, PhD [facility]  [facility]  [address], [address] [place], [place] [id number] [phone]

## 2018-04-21 NOTE — Consult Note (Signed)
Cardiology Consultation:   Patient ID: Julie Gonzales; [id number]; 09-19-1950   Admit date: 04/20/2018 Date of Consult: 04/21/2018  Primary Care Provider: Deliah BostonLouis, Rachel, PA-C Primary Cardiologist: Croitoru   Patient Profile:   Julie Gonzales is a 67 y.o. female with a hx of HTN, HLD, known LBBB, anxiety, GERD, who is being seen today for the evaluation of chest pain at the request of Dr. Rush Landmarkegeler.  History of Present Illness:   Julie Gonzales is a 67 y.o. female with a hx of HTN, HLD, known LBBB who is being seen today for the evaluation of chest pain.  The patient has a history of chest pain that has previously been felt to have atypical features. She was seen in cardiology clinic on 04/18/18 for preoperative evaluation prior to knee surgery. At that visit, she reported exertional chest tightness with radiation to her neck. Coronary CTA was ordered and is pending.  She presented to the ED this evening with recurrent chest pain and back pain. She reports that she experiences chronic chest discomfort with exertion that is sometimes associated with dyspnea or diaphoresis. Yesterday, she experienced pain in her L back that she has not experienced before. This pain has been mostly persistent since that time. She experienced another episode of chest discomfort today, which is is similar to her prior episodes of chest pain.   In the ED,  ECG showed known LBBB with new TWI in inferolateral leads. Troponin was negative x1. She is being admitted to the hospitalist team, and cardiology consult is requested.   Past Medical History:  Diagnosis Date  . Anxiety   . Arthritis   . Dyspnea    with exertion  . Essential hypertension 07/14/2015  . GERD (gastroesophageal reflux disease)    "silent"  . Hyperlipidemia 07/14/2015  . LBBB (left bundle branch block) 07/14/2015  . PONV (postoperative nausea and vomiting)    prolonged sedation    Past Surgical History:  Procedure Laterality Date  .  ANUS SURGERY    . BREAST SURGERY Left    benign cyst removal  . CESAREAN SECTION    . CHOLECYSTECTOMY    . COLONOSCOPY    . DEBRIDEMENT LEG Right   . HYSTEROSCOPY W/D&C N/A 06/16/2017   Procedure: DILATATION AND CURETTAGE /HYSTEROSCOPY;  Surgeon: Zelphia CairoAdkins, Gretchen, MD;  Location: [facility];  Service: Gynecology;  Laterality: N/A;  . nuclear medicine stress test    . UPPER GI ENDOSCOPY       Home Medications:  Prior to Admission medications   Medication Sig Start Date End Date Taking? Authorizing Provider  acetaminophen (TYLENOL) 500 MG tablet Take 500 mg by mouth every 6 (six) hours as needed for mild pain.    Yes [provider]  atenolol-chlorthalidone (TENORETIC) 50-25 MG tablet Take 1 tablet by mouth every evening.  04/30/15  Yes [provider]  Cholecalciferol (VITAMIN D3) 2000 UNITS TABS Take 2,000 Units by mouth every evening.    Yes [provider]  losartan (COZAAR) 25 MG tablet Take 50 mg by mouth every evening. 01/04/18  Yes [provider]  Melatonin 10 MG TABS Take 10 mg by mouth at bedtime.   Yes [provider]  PARoxetine (PAXIL) 20 MG tablet Take 20 mg by mouth every evening.   Yes [provider]  potassium chloride (MICRO-K) 10 MEQ CR capsule Take 10 mEq by mouth every evening. 03/19/18  Yes [provider]  pravastatin (PRAVACHOL) 40 MG tablet Take 40 mg by  mouth every evening.  07/09/15  Yes [provider]  Probiotic Product (PROBIOTIC PO) Take 2 capsules by mouth every evening.   Yes [provider]    Inpatient Medications: Scheduled Meds: . aspirin EC  325 mg Oral Daily  . atenolol  50 mg Oral Daily   And  . chlorthalidone  25 mg Oral Daily  . cholecalciferol  2,000 Units Oral QPM  . losartan  50 mg Oral QPM  . Melatonin  9 mg Oral QHS  . PARoxetine  20 mg Oral QPM  . pravastatin  40 mg Oral QPM   Continuous Infusions:  PRN Meds: acetaminophen, morphine injection, nitroGLYCERIN,  ondansetron (ZOFRAN) IV  Allergies:    Allergies  Allergen Reactions  . Amlodipine Other (See Comments)    Edema  . Ramipril Cough  . Ciprofloxacin     IV dose causes patient to hold her breath    Social History:   Social History   Socioeconomic History  . Marital status: Married    Spouse name: Not on file  . Number of children: Not on file  . Years of education: Not on file  . Highest education level: Not on file  Occupational History  . Not on file  Social Needs  . Financial resource strain: Not on file  . Food insecurity:    Worry: Not on file    Inability: Not on file  . Transportation needs:    Medical: Not on file    Non-medical: Not on file  Tobacco Use  . Smoking status: Former Games developer  . Smokeless tobacco: Never Used  Substance and Sexual Activity  . Alcohol use: Yes    Comment: occasionally  . Drug use: No  . Sexual activity: Not on file  Lifestyle  . Physical activity:    Days per week: Not on file    Minutes per session: Not on file  . Stress: Not on file  Relationships  . Social connections:    Talks on phone: Not on file    Gets together: Not on file    Attends religious service: Not on file    Active member of club or organization: Not on file    Attends meetings of clubs or organizations: Not on file    Relationship status: Not on file  . Intimate partner violence:    Fear of current or ex partner: Not on file    Emotionally abused: Not on file    Physically abused: Not on file    Forced sexual activity: Not on file  Other Topics Concern  . Not on file  Social History Narrative  . Not on file    Family History:    Family History  Problem Relation Age of Onset  . Diabetes Mother   . Hypertension Mother   . Lung cancer Father   . Breast cancer Neg Hx      ROS:  Please see the history of present illness.  All other ROS reviewed and negative.     Physical Exam/Data:   Vitals:   04/20/18 2345 04/21/18 0000 04/21/18 0015  04/21/18 0046  BP: (!) 138/57 (!) 149/57 (!) 140/50 (!) 146/63  Pulse: 74 66 67 67  Resp: 12 14 13 16   Temp:    98.5 F (36.9 C)  TempSrc:    Oral  SpO2: 96% 96% 96% 96%   No intake or output data in the 24 hours ending 04/21/18 0253 There were no vitals filed for this visit.  There is no height or weight on file to calculate BMI.  General:  Well nourished, well developed, in no acute distress  HEENT: normal Neck: no JVD Endocrine:  No thryomegaly Cardiac:  normal S1, S2; RRR; no murmur  Lungs:  clear to auscultation bilaterally, no wheezing, rhonchi or rales  Abd: soft, nontender, no hepatomegaly  Ext: no edema Musculoskeletal:  No deformities, BUE and BLE strength normal and equal Skin: warm and dry  Neuro:  No focal abnormalities noted Psych:  Normal affect   EKG:  The EKG was personally reviewed and demonstrates:  NSR with LBBB and inferolateral TWI Telemetry:  Telemetry was personally reviewed and demonstrates:  NSR with LBBB  Relevant CV Studies:  Echo 07/2015: - Left ventricle: The cavity size was normal. Systolic function was   normal. The estimated ejection fraction was in the range of 50%   to 55%. Wall motion was normal; there were no regional wall   motion abnormalities. There was an increased relative   contribution of atrial contraction to ventricular filling.   Doppler parameters are consistent with abnormal left ventricular   relaxation (grade 1 diastolic dysfunction). - Aortic valve: Mild diffuse thickening and calcification,   consistent with sclerosis. There was mild regurgitation. - Mitral valve: Calcified annulus.  Nuclear Stress 2016:  The left ventricular ejection fraction is normal (55-65%).  Nuclear stress EF: 55%.  There was no ST segment deviation noted during stress.  Defect 1: There is a medium defect of mild severity present in the basal anteroseptal and mid anteroseptal location.  The study is normal.   Low risk stress nuclear study  with LBBB-related septal perfusion artifact, otherwise normal perfusion, and normal left ventricular regional and global systolic function.     Laboratory Data:  Chemistry Recent Labs  Lab 04/20/18 2027  NA 142  K 3.4*  CL 102  CO2 27  GLUCOSE 135*  BUN 15  CREATININE 1.19*  CALCIUM 9.4  GFRNONAA 46*  GFRAA 54*  ANIONGAP 13    No results for input(s): PROT, ALBUMIN, AST, ALT, ALKPHOS, BILITOT in the last 168 hours. Hematology Recent Labs  Lab 04/20/18 2027  WBC 8.0  RBC 4.33  HGB 13.8  HCT 41.9  MCV 96.8  MCH 31.9  MCHC 32.9  RDW 12.2  PLT 240   Cardiac Enzymes Recent Labs  Lab 04/21/18 0010  TROPONINI <0.03    Recent Labs  Lab 04/20/18 2045  TROPIPOC 0.01    BNPNo results for input(s): BNP, PROBNP in the last 168 hours.  DDimer No results for input(s): DDIMER in the last 168 hours.  Radiology/Studies:  Dg Chest 2 View  Result Date: 04/20/2018 CLINICAL DATA:  Patient with sharp left-sided chest pain. EXAM: CHEST - 2 VIEW COMPARISON:  None. FINDINGS: Monitoring leads overlie the patient. Normal cardiac and mediastinal contours. No consolidative pulmonary opacities. No pleural effusion or pneumothorax. IMPRESSION: No acute cardiopulmonary process. Electronically Signed   By: Annia Belt M.D.   On: 04/20/2018 21:07    Assessment and Plan:   Chest pain The patient has a history of chest pain with atypical features. Prior workup has been unremarkable, but repeat ischemic evaluation with coronary CTA was recently ordered as an outpatient for preoperative clearance. She presents to the ED today with continued intermittent chest discomfort and new back pain. ECG shows some new changes that may be related to ischemia (vs underlying conduction disease), but troponin is negative and reassuring against MI. At this time, rule out is  appropriate with workup of new back pain.  -Continue to trend troponin -Agree with workup of back pain per primary team -We will follow  along to determine type and timing of ischemic workup (inpatient vs discharge home for scheduled outpatient CTA)  HTN BP mildly elevated in the ED. Uncontrolled BP has been suspected previously to be a contributing factor to her CP. -Continue home medications  HLD -Continue home statin    For questions or updates, please contact [facility] Please consult [email or web address] for contact info under Cardiology/STEMI.   Signed, Ernest Mallickaylor Syretta Kochel, MD  04/21/2018 2:53 AM

## 2018-04-21 NOTE — H&P (Signed)
History and Physical    Julie Gonzales:[id number] DOB: [birth year] DOA: 04/20/2018  PCP: Deliah Boston, PA-C  Patient coming from: Home  Chief Complaint: Chest pain back pain  HPI: Julie Gonzales is a 67 y.o. female with medical history significant of hypertension, hyperlipidemia comes in with substernal chest pain in the center of her chest that lasted for about 15 seconds today while she was driving.  No associated shortness of breath or radiation.  She has been also having back pain which she feels is unrelated.  Patient recently been seen by cardiology for preoperative evaluation for knee surgery.  They are supposed to arrange for her to get a coronary CT done for preoperative evaluation which has not been done yet.  Patient has no previous history of coronary artery disease.  She currently denies any chest pain and she denies any associated nausea vomiting or shortness of breath with this issues.  She denies any lower extremity edema or swelling also.  She denies any pleuritic nature to the chest pain.  Again it lasted 15 seconds and then resolved spontaneously.  Review of Systems: As per HPI otherwise 10 point review of systems negative.   Past Medical History:  Diagnosis Date  . Anxiety   . Arthritis   . Dyspnea    with exertion  . Essential hypertension 07/14/2015  . GERD (gastroesophageal reflux disease)    "silent"  . Hyperlipidemia 07/14/2015  . LBBB (left bundle branch block) 07/14/2015  . PONV (postoperative nausea and vomiting)    prolonged sedation    Past Surgical History:  Procedure Laterality Date  . ANUS SURGERY    . BREAST SURGERY Left    benign cyst removal  . CESAREAN SECTION    . CHOLECYSTECTOMY    . COLONOSCOPY    . DEBRIDEMENT LEG Right   . HYSTEROSCOPY W/D&C N/A 06/16/2017   Procedure: DILATATION AND CURETTAGE /HYSTEROSCOPY;  Surgeon: Zelphia Cairo, MD;  Location: [facility];  Service: Gynecology;  Laterality: N/A;  . nuclear medicine stress test      . UPPER GI ENDOSCOPY       reports that she has quit smoking. She has never used smokeless tobacco. She reports that she drinks alcohol. She reports that she does not use drugs.  Allergies  Allergen Reactions  . Amlodipine Other (See Comments)    Edema  . Ramipril Cough  . Ciprofloxacin     IV dose causes patient to hold her breath    Family History  Problem Relation Age of Onset  . Diabetes Mother   . Hypertension Mother   . Lung cancer Father   . Breast cancer Neg Hx     Prior to Admission medications   Medication Sig Start Date End Date Taking? Authorizing Provider  acetaminophen (TYLENOL) 500 MG tablet Take 500 mg by mouth every 6 (six) hours as needed for mild pain.    Yes [provider]  atenolol-chlorthalidone (TENORETIC) 50-25 MG tablet Take 1 tablet by mouth every evening.  04/30/15  Yes [provider]  Cholecalciferol (VITAMIN D3) 2000 UNITS TABS Take 2,000 Units by mouth every evening.    Yes [provider]  losartan (COZAAR) 25 MG tablet Take 50 mg by mouth every evening. 01/04/18  Yes [provider]  Melatonin 10 MG TABS Take 10 mg by mouth at bedtime.   Yes [provider]  PARoxetine (PAXIL) 20 MG tablet Take 20 mg by mouth every evening.   Yes [provider]  potassium chloride (MICRO-K) 10 MEQ CR capsule Take 10 mEq by mouth every evening. 03/19/18  Yes [provider]  pravastatin (PRAVACHOL) 40 MG tablet Take 40 mg by mouth every evening.  07/09/15  Yes [provider]  Probiotic Product (PROBIOTIC PO) Take 2 capsules by mouth every evening.   Yes [provider]    Physical Exam: Vitals:   04/20/18 2138 04/20/18 2315 04/20/18 2330 04/20/18 2345  BP: (!) 146/51 128/60 (!) 136/59 (!) 138/57  Pulse: 70 75 66 74  Resp: 14 15 13 12   Temp:      TempSrc:      SpO2: 97% 92% 94% 96%      Constitutional: NAD, calm, comfortable Vitals:   04/20/18 2138 04/20/18 2315 04/20/18  2330 04/20/18 2345  BP: (!) 146/51 128/60 (!) 136/59 (!) 138/57  Pulse: 70 75 66 74  Resp: 14 15 13 12   Temp:      TempSrc:      SpO2: 97% 92% 94% 96%   Eyes: PERRL, lids and conjunctivae normal ENMT: Mucous membranes are moist. Posterior pharynx clear of any exudate or lesions.Normal dentition.  Neck: normal, supple, no masses, no thyromegaly Respiratory: clear to auscultation bilaterally, no wheezing, no crackles. Normal respiratory effort. No accessory muscle use.  Cardiovascular: Regular rate and rhythm, no murmurs / rubs / gallops. No extremity edema. 2+ pedal pulses. No carotid bruits.  Abdomen: no tenderness, no masses palpated. No hepatosplenomegaly. Bowel sounds positive.  Musculoskeletal: no clubbing / cyanosis. No joint deformity upper and lower extremities. Good ROM, no contractures. Normal muscle tone.  Skin: no rashes, lesions, ulcers. No induration Neurologic: CN 2-12 grossly intact. Sensation intact, DTR normal. Strength 5/5 in all 4.  Psychiatric: Normal judgment and insight. Alert and oriented x 3. Normal mood.    Labs on Admission: I have personally reviewed following labs and imaging studies  CBC: Recent Labs  Lab 04/20/18 2027  WBC 8.0  HGB 13.8  HCT 41.9  MCV 96.8  PLT 240   Basic Metabolic Panel: Recent Labs  Lab 04/20/18 2027  NA 142  K 3.4*  CL 102  CO2 27  GLUCOSE 135*  BUN 15  CREATININE 1.19*  CALCIUM 9.4   GFR: Estimated Creatinine Clearance: 47.2 mL/min (A) (by C-G formula based on SCr of 1.19 mg/dL (H)). Liver Function Tests: No results for input(s): AST, ALT, ALKPHOS, BILITOT, PROT, ALBUMIN in the last 168 hours. No results for input(s): LIPASE, AMYLASE in the last 168 hours. No results for input(s): AMMONIA in the last 168 hours. Coagulation Profile: No results for input(s): INR, PROTIME in the last 168 hours. Cardiac Enzymes: No results for input(s): CKTOTAL, CKMB, CKMBINDEX, TROPONINI in the last 168 hours. BNP (last 3  results) No results for input(s): PROBNP in the last 8760 hours. HbA1C: No results for input(s): HGBA1C in the last 72 hours. CBG: No results for input(s): GLUCAP in the last 168 hours. Lipid Profile: No results for input(s): CHOL, HDL, LDLCALC, TRIG, CHOLHDL, LDLDIRECT in the last 72 hours. Thyroid Function Tests: No results for input(s): TSH, T4TOTAL, FREET4, T3FREE, THYROIDAB in the last 72 hours. Anemia Panel: No results for input(s): VITAMINB12, FOLATE, FERRITIN, TIBC, IRON, RETICCTPCT in the last 72 hours. Urine analysis: No results found for: COLORURINE, APPEARANCEUR, LABSPEC, PHURINE, GLUCOSEU, HGBUR, BILIRUBINUR, KETONESUR, PROTEINUR, UROBILINOGEN, NITRITE, LEUKOCYTESUR Sepsis Labs: !!!!!!!!!!!!!!!!!!!!!!!!!!!!!!!!!!!!!!!!!!!! @LABRCNTIP (procalcitonin:4,lacticidven:4) )No results found for this or any previous visit (from the past 240 hour(s)).   Radiological Exams on Admission: Dg Chest 2 View  Result Date: 04/20/2018 CLINICAL DATA:  Patient with sharp left-sided chest pain. EXAM: CHEST - 2 VIEW COMPARISON:  None. FINDINGS: Monitoring leads overlie the patient. Normal cardiac and mediastinal contours. No consolidative pulmonary opacities. No pleural effusion or pneumothorax. IMPRESSION: No acute cardiopulmonary process. Electronically Signed   By: Annia Beltrew  Davis M.D.   On: 04/20/2018 21:07    EKG: Independently reviewed.  Nonspecific interventricular conduction delay old EKG with left bundle branch block  Old chart reviewed  Case discussed with Dr. Rush Landmarkegeler in the ED  Assessment/Plan 67 year old female with atypical chest pain whose undergone preoperative evaluation by cardiology service Principal Problem:   Chest pain-aspirin.  Serial troponin.  Currently chest pain-free.  Obtain cardiology consult for further recommendations for work-up.  Initial troponin negative.  Active Problems:   Essential hypertension-continue home meds    Dyslipidemia-check fasting lipid panel      DVT prophylaxis: SCDs Code Status: Full Family Communication: Daughter Disposition Plan: Tomorrow Consults called: Cardiology Admission status: Observation   Montario Zilka A MD [facility]  If 7PM-7AM, please contact night-coverage [email or web address] Password [email or web address]  04/21/2018, 12:03 AM

## 2018-04-21 NOTE — Progress Notes (Signed)
Discharge instructions reviewed with pt. Pt has no questions at this time. Pt states she is ready to go home and denies any pain. IV d/c.

## 2018-04-24 ENCOUNTER — Ambulatory Visit (HOSPITAL_COMMUNITY)
Admission: RE | Admit: 2018-04-24 | Discharge: 2018-04-24 | Disposition: A | Discharge status: Home / Self Care | Payer: Medicare Other | Source: Ambulatory Visit | Attending: Cardiology | Admitting: Cardiology

## 2018-04-24 ENCOUNTER — Ambulatory Visit (HOSPITAL_COMMUNITY): Admission: RE | Admit: 2018-04-24 | Payer: Medicare Other | Source: Ambulatory Visit

## 2018-04-24 ENCOUNTER — Telehealth: Payer: Self-pay | Admitting: Cardiology

## 2018-04-24 DIAGNOSIS — R079 Chest pain, unspecified: Secondary | ICD-10-CM | POA: Insufficient documentation

## 2018-04-24 DIAGNOSIS — R9431 Abnormal electrocardiogram [ECG] [EKG]: Secondary | ICD-10-CM | POA: Diagnosis present

## 2018-04-24 MED ORDER — NITROGLYCERIN 0.4 MG SL SUBL
0.8000 mg | SUBLINGUAL_TABLET | Freq: Once | SUBLINGUAL | Status: AC
  Administered 2018-04-24: 0.8 mg via SUBLINGUAL
  Filled 2018-04-24: qty 25

## 2018-04-24 MED ORDER — METOPROLOL TARTRATE 5 MG/5ML IV SOLN
INTRAVENOUS | Status: AC
  Filled 2018-04-24: qty 20

## 2018-04-24 MED ORDER — NITROGLYCERIN 0.4 MG SL SUBL
SUBLINGUAL_TABLET | SUBLINGUAL | Status: AC
  Filled 2018-04-24: qty 2

## 2018-04-24 MED ORDER — METOPROLOL TARTRATE 5 MG/5ML IV SOLN
10.0000 mg | INTRAVENOUS | Status: DC | PRN
Start: 2018-04-24 — End: 2018-04-25
  Administered 2018-04-24: 10 mg via INTRAVENOUS
  Filled 2018-04-24: qty 10

## 2018-04-24 MED ORDER — IOPAMIDOL (ISOVUE-370) INJECTION 76%
100.0000 mL | Freq: Once | INTRAVENOUS | Status: AC | PRN
  Administered 2018-04-24: 100 mL via INTRAVENOUS

## 2018-04-24 NOTE — Telephone Encounter (Signed)
I reviewed pt's coronary CTA- mild CAD. OK for surgery.  Corine ShelterLUKE Irving Bloor PA-C 04/24/2018 3:48 PM

## 2018-04-24 NOTE — Progress Notes (Signed)
CT heart completed. Tolerated well. D/C home with husband. Awake and alert. In no distress.

## 2018-04-26 ENCOUNTER — Encounter (HOSPITAL_COMMUNITY): Admission: RE | Payer: Self-pay | Source: Ambulatory Visit

## 2018-04-26 ENCOUNTER — Ambulatory Visit (HOSPITAL_COMMUNITY): Admission: RE | Admit: 2018-04-26 | Payer: Medicare Other | Source: Ambulatory Visit | Admitting: Orthopedic Surgery

## 2018-04-26 SURGERY — ARTHROSCOPY, KNEE, WITH MEDIAL MENISCECTOMY
Anesthesia: Choice | Laterality: Left

## 2018-05-01 NOTE — H&P (Signed)
Patient's anticipated LOS is less than 2 midnights, meeting these requirements: - Younger than 765 - Lives within 1 hour of care - Has a competent adult at home to recover with post-op recover - NO history of  - Chronic pain requiring opiods  - Diabetes  - Coronary Artery Disease  - Heart failure  - Heart attack  - Stroke  - DVT/VTE  - Cardiac arrhythmia  - Respiratory Failure/COPD  - Renal failure  - Anemia  - Advanced Liver disease       Julie Gonzales is an 67 y.o. female.    Chief Complaint: left knee pain  HPI: Pt is a 67 y.o. female complaining of left knee pain for multiple years. Pain had continually increased since the beginning. X-rays in the clinic show meniscal tear of the left knee. Pt has tried various conservative treatments which have failed to alleviate their symptoms, including injections and therapy. Various options are discussed with the patient. Risks, benefits and expectations were discussed with the patient. Patient understand the risks, benefits and expectations and wishes to proceed with surgery.   PCP:  Deliah BostonLouis, Rachel, PA-C  D/C Plans: Home  PMH: Past Medical History:  Diagnosis Date  . Anxiety   . Arthritis   . Dyspnea    with exertion  . Essential hypertension 07/14/2015  . GERD (gastroesophageal reflux disease)    "silent"  . Hyperlipidemia 07/14/2015  . LBBB (left bundle branch block) 07/14/2015  . PONV (postoperative nausea and vomiting)    prolonged sedation    PSH: Past Surgical History:  Procedure Laterality Date  . ANUS SURGERY    . BREAST SURGERY Left    benign cyst removal  . CESAREAN SECTION    . CHOLECYSTECTOMY    . COLONOSCOPY    . DEBRIDEMENT LEG Right   . HYSTEROSCOPY W/D&C N/A 06/16/2017   Procedure: DILATATION AND CURETTAGE /HYSTEROSCOPY;  Surgeon: Zelphia CairoAdkins, Gretchen, MD;  Location: [facility];  Service: Gynecology;  Laterality: N/A;  . nuclear medicine stress test    . UPPER GI ENDOSCOPY      Social History:  reports  that she has quit smoking. She has never used smokeless tobacco. She reports that she drinks alcohol. She reports that she does not use drugs.  Allergies:  Allergies  Allergen Reactions  . Amlodipine Other (See Comments)    Edema  . Ramipril Cough  . Ciprofloxacin     IV dose causes patient to hold her breath    Medications: No current facility-administered medications for this encounter.    Current Outpatient Medications  Medication Sig Dispense Refill  . acetaminophen (TYLENOL) 500 MG tablet Take 500 mg by mouth every 6 (six) hours as needed for mild pain.     Marland Kitchen. atenolol-chlorthalidone (TENORETIC) 50-25 MG tablet Take 1 tablet by mouth every evening.     . Cholecalciferol (VITAMIN D3) 2000 UNITS TABS Take 2,000 Units by mouth every evening.     Marland Kitchen. losartan (COZAAR) 25 MG tablet Take 50 mg by mouth every evening.  1  . Melatonin 10 MG TABS Take 10 mg by mouth at bedtime.    Marland Kitchen. PARoxetine (PAXIL) 20 MG tablet Take 20 mg by mouth every evening.    . potassium chloride (MICRO-K) 10 MEQ CR capsule Take 10 mEq by mouth every evening.  1  . pravastatin (PRAVACHOL) 40 MG tablet Take 40 mg by mouth every evening.     . Probiotic Product (PROBIOTIC PO) Take 2 capsules by mouth every evening.  No results found for this or any previous visit (from the past 48 hour(s)). No results found.  ROS: Pain with rom of the left lower extremity  Physical Exam: Alert and oriented 67 y.o. female in no acute distress Cranial nerves 2-12 intact Cervical spine: full rom with no tenderness, nv intact distally Chest: active breath sounds bilaterally, no wheeze rhonchi or rales Heart: regular rate and rhythm, no murmur Abd: non tender non distended with active bowel sounds Hip is stable with rom  Left knee medial joint line tenderness Slight antalgic gait No rashes or edema distally  Assessment/Plan Assessment: left knee meniscal tear  Plan:  Patient will undergo a left knee scope by Dr.  Ranell Patrick at [facility]. Risks benefits and expectations were discussed with the patient. Patient understand risks, benefits and expectations and wishes to proceed. Preoperative templating of the joint replacement has been completed, documented, and submitted to the Operating Room personnel in order to optimize intra-operative equipment management.   Alphonsa Overall PA-C, MPAS [facility] is now [employer] [address], [place], [place] [address] Phone: [phone] [email or web address] Facebook  [employer]

## 2018-05-11 ENCOUNTER — Ambulatory Visit: Payer: Medicare Other | Admitting: Cardiovascular Disease

## 2018-05-11 ENCOUNTER — Encounter: Payer: Self-pay | Admitting: Cardiovascular Disease

## 2018-05-11 VITALS — BP 153/75 | HR 61 | Ht 63.0 in | Wt 183.0 lb

## 2018-05-11 DIAGNOSIS — I10 Essential (primary) hypertension: Secondary | ICD-10-CM | POA: Diagnosis not present

## 2018-05-11 DIAGNOSIS — E785 Hyperlipidemia, unspecified: Secondary | ICD-10-CM | POA: Diagnosis not present

## 2018-05-11 DIAGNOSIS — I447 Left bundle-branch block, unspecified: Secondary | ICD-10-CM

## 2018-05-11 DIAGNOSIS — I2510 Atherosclerotic heart disease of native coronary artery without angina pectoris: Secondary | ICD-10-CM

## 2018-05-11 DIAGNOSIS — Z01810 Encounter for preprocedural cardiovascular examination: Secondary | ICD-10-CM | POA: Diagnosis not present

## 2018-05-11 MED ORDER — LOSARTAN POTASSIUM 100 MG PO TABS: 100.0000 mg | ORAL_TABLET | Freq: Every evening | ORAL | 3 refills | Status: AC

## 2018-05-11 NOTE — Patient Instructions (Signed)
Medication Instructions: Dr Royann Shivers has recommended making the following medication changes: 1. INCREASE Losartan to 100 mg daily  Your physician has requested that you regularly monitor your blood pressure at home. Please use the same machine to check your blood pressure daily. Keep a record of your blood pressures using the log sheet provided. In 1-2 weeks, please report your readings back to Dr C. You may use our online patient portal 'MyChart' or you can call the office to speak with a nurse.  Labwork: Your physician recommends that you return for lab work at your convenience - FASTING.  Testing/Procedures: NONE ORDERED  Follow-up: Dr Royann Shivers recommends that you schedule a follow-up appointment in 12 months. You will receive a reminder letter in the mail two months in advance. If you don't receive a letter, please call our office to schedule the follow-up appointment.  If you need a refill on your cardiac medications before your next appointment, please call your pharmacy.

## 2018-05-11 NOTE — Progress Notes (Signed)
Cardiology Office Note:    Date:  05/12/2018   ID:  Julie Gonzales, DOB 26-Oct-1950, MRN [id number]  PCP:  Julie Boston, PA-C  Cardiologist:  No primary care provider on file.   Referring MD: Julie Boston, PA-C   Chief Complaint  Patient presents with  . Follow-up    Cardiac CT  Preoperative cardiovascular examination  History of Present Illness:    Julie Gonzales is a 67 y.o. female with a hx of LBBB, HTN and hypercholesterolemia, planning to undergo left knee arthroscopic surgery next week, here to follow-up on a recently performed coronary CT angiogram.  This study did not show any significant coronary artery stenoses.  She did however have a calcium score that is elevated, placing her in the 72nd percentile for her age and gender.  This study was ordered because she describes some atypical symptoms of chest tightness with walking she does not have any other symptoms of cardiac disease.  To me she describes the discomfort as being sharp and localized primarily in her left shoulder, radiating to her chest.  It occurred about 2 weeks ago and led to an evaluation in the emergency room.  Cardiac enzymes were normal.  Her blood pressure today is high, but she reports that at home it is consistently well controlled and that she has situational hypertension.  However, she defines a "normal" blood pressure as anything in the 140s.  She had a normal echocardiogram and a normal nuclear stress perfusion study in 2016 (except LBBB related septal perfusion artifact).  Her husband Julie Gonzales is also my patient.  Past Medical History:  Diagnosis Date  . Anxiety   . Arthritis   . Dyspnea    with exertion  . Essential hypertension 07/14/2015  . GERD (gastroesophageal reflux disease)    "silent"  . Hyperlipidemia 07/14/2015  . LBBB (left bundle branch block) 07/14/2015  . PONV (postoperative nausea and vomiting)    prolonged sedation    Past Surgical History:  Procedure Laterality Date  .  ANUS SURGERY    . BREAST SURGERY Left    benign cyst removal  . CESAREAN SECTION    . CHOLECYSTECTOMY    . COLONOSCOPY    . DEBRIDEMENT LEG Right   . HYSTEROSCOPY W/D&C N/A 06/16/2017   Procedure: DILATATION AND CURETTAGE /HYSTEROSCOPY;  Surgeon: Julie Cairo, MD;  Location: [facility];  Service: Gynecology;  Laterality: N/A;  . nuclear medicine stress test    . UPPER GI ENDOSCOPY      Current Medications: Current Meds  Medication Sig  . acetaminophen (TYLENOL) 500 MG tablet Take 500 mg by mouth every 6 (six) hours as needed for mild pain.   Marland Kitchen atenolol-chlorthalidone (TENORETIC) 50-25 MG tablet Take 1 tablet by mouth every evening.   . Cholecalciferol (VITAMIN D3) 2000 UNITS TABS Take 2,000 Units by mouth every evening.   Marland Kitchen losartan (COZAAR) 100 MG tablet Take 1 tablet (100 mg total) by mouth every evening.  . Melatonin 10 MG TABS Take 10 mg by mouth at bedtime.  Marland Kitchen PARoxetine (PAXIL) 20 MG tablet Take 20 mg by mouth every evening.  . potassium chloride (MICRO-K) 10 MEQ CR capsule Take 10 mEq by mouth every evening.  . pravastatin (PRAVACHOL) 40 MG tablet Take 40 mg by mouth every evening.   . Probiotic Product (PROBIOTIC PO) Take 2 capsules by mouth 4 (four) times a week.   . [DISCONTINUED] losartan (COZAAR) 25 MG tablet Take 50 mg by mouth every evening.  Allergies:   Amlodipine; Ramipril; and Ciprofloxacin   Social History   Socioeconomic History  . Marital status: Married    Spouse name: Not on file  . Number of children: Not on file  . Years of education: Not on file  . Highest education level: Not on file  Occupational History  . Not on file  Social Needs  . Financial resource strain: Not on file  . Food insecurity:    Worry: Not on file    Inability: Not on file  . Transportation needs:    Medical: Not on file    Non-medical: Not on file  Tobacco Use  . Smoking status: Former Games developer  . Smokeless tobacco: Never Used  Substance and Sexual Activity  . Alcohol  use: Yes    Comment: occasionally  . Drug use: No  . Sexual activity: Not on file  Lifestyle  . Physical activity:    Days per week: Not on file    Minutes per session: Not on file  . Stress: Not on file  Relationships  . Social connections:    Talks on phone: Not on file    Gets together: Not on file    Attends religious service: Not on file    Active member of club or organization: Not on file    Attends meetings of clubs or organizations: Not on file    Relationship status: Not on file  Other Topics Concern  . Not on file  Social History Narrative  . Not on file     Family History: The patient's family history includes Diabetes in her mother; Hypertension in her mother; Lung cancer in her father. There is no history of Breast cancer.  ROS:   Please Gonzales the history of present illness.     All other systems reviewed and are negative.  EKGs/Labs/Other Studies Reviewed:    The following studies were reviewed today: Coronary CT angiogram from August 2019  EKG:  EKG is not ordered today.  The ekg ordered April 20, 2018 demonstrates sinus rhythm with left branch block and right axis deviation.  QRS axis is different and this is associated with a change in the pattern of repolarization as well.   Recent Labs: 06/06/2017: ALT 22 04/20/2018: BUN 15; Creatinine, Ser 1.19; Hemoglobin 13.8; Platelets 240; Potassium 3.4; Sodium 142  Recent Lipid Panel No results found for: CHOL, TRIG, HDL, CHOLHDL, VLDL, LDLCALC, LDLDIRECT  Physical Exam:    VS:  BP (!) 153/75   Pulse 61   Ht 5\' 3"  (1.6 m)   Wt 183 lb (83 kg)   BMI 32.42 kg/m     Wt Readings from Last 3 Encounters:  05/11/18 183 lb (83 kg)  04/21/18 179 lb 14.4 oz (81.6 kg)  04/18/18 181 lb 12.8 oz (82.5 kg)     GEN: Obese, well nourished, well developed in no acute distress HEENT: Normal NECK: No JVD; No carotid bruits LYMPHATICS: No lymphadenopathy CARDIAC: Paradoxically split S2 ,RRR, no murmurs, rubs,  gallops RESPIRATORY:  Clear to auscultation without rales, wheezing or rhonchi  ABDOMEN: Soft, non-tender, non-distended MUSCULOSKELETAL:  No edema; No deformity  SKIN: Warm and dry NEUROLOGIC:  Alert and oriented x 3 PSYCHIATRIC:  Normal affect   ASSESSMENT:    1. Dyslipidemia   2. Preoperative cardiovascular examination   3. Atherosclerosis of native coronary artery of native heart without angina pectoris   4. Essential hypertension   5. LBBB (left bundle branch block)    PLAN:  In order of problems listed above:  1. Preop CV eval: Low risk for major cardiovascular complications with planned arthroscopic surgery.   2. CAD: Although no serious coronary stenoses at present, aggressive risk factor treatment is appropriate since her calcium score places her at increased risk for disease progression and complications. 3. HTN: Increase losartan dose.  Target blood pressure 130/80 or less. 4. HLP: Check lipid profile.  Check cardiac LDL should be 70 or less in view of the abnormalities on her coronary CT angiogram. 5. LBBB: Long-standing abnormality (documented at least since 2004), not associated with any structural heart disease.  Discussed the fact that it may indicate eventual progression to AV block and need for pacemaker therapy but this could take decades.   Medication Adjustments/Labs and Tests Ordered: Current medicines are reviewed at length with the patient today.  Concerns regarding medicines are outlined above.  Orders Placed This Encounter  Procedures  . Lipid panel   Meds ordered this encounter  Medications  . losartan (COZAAR) 100 MG tablet    Sig: Take 1 tablet (100 mg total) by mouth every evening.    Dispense:  90 tablet    Refill:  3    Patient Instructions  Medication Instructions: Dr Royann Shivers has recommended making the following medication changes: 1. INCREASE Losartan to 100 mg daily  Your physician has requested that you regularly monitor your blood  pressure at home. Please use the same machine to check your blood pressure daily. Keep a record of your blood pressures using the log sheet provided. In 1-2 weeks, please report your readings back to Dr C. You may use our online patient portal 'MyChart' or you can call the office to speak with a nurse.  Labwork: Your physician recommends that you return for lab work at your convenience - FASTING.  Testing/Procedures: NONE ORDERED  Follow-up: Dr Royann Shivers recommends that you schedule a follow-up appointment in 12 months. You will receive a reminder letter in the mail two months in advance. If you don't receive a letter, please call our office to schedule the follow-up appointment.  If you need a refill on your cardiac medications before your next appointment, please call your pharmacy.    Signed, Thurmon Fair, MD  05/12/2018 2:14 PM    [facility]

## 2018-05-11 NOTE — Pre-Procedure Instructions (Addendum)
Julie Gonzales  05/11/2018      [facility] [address], [place] - [address] [place] [address] Phone: [phone] Fax: [phone]  [facility] [address] [place] - [address] [place] [address] Phone: [phone] Fax: [phone]    Your procedure is scheduled on Friday, Sept. 13th   Report to [facility] Admitting at 11:30 AM             (posted surgery time 1:30p - 2:30p)   Call this number if you have problems the morning of surgery:  [phone]   Remember:   Do not eat any foods or drink any liquids after midnight,Thursday              PLEASE NOTE - 4 - 5 days prior to surgery, STOP TAKING ANY vitamins, herbal supplements, anti-inflammatories, blood thinners.   Take these medicines the morning of surgery with A SIP OF WATER : Tenoretic    Do not wear jewelry, make-up or nail polish.  Do not wear lotions, powders,  perfumes, or deodorant.  Do not shave 48 hours prior to surgery.    Do not bring valuables to the hospital.  [facility] is not responsible for any belongings or valuables.  Contacts, dentures or bridgework may not be worn into surgery.  Leave your suitcase in the car.  After surgery it may be brought to your room.  For patients admitted to the hospital, discharge time will be determined by your treatment team.  Please read over the following fact sheets that you were given. MRSA Information and Surgical Site Infection Prevention      [facility]- Preparing For Surgery  Before surgery, you can play an important role. Because skin is not sterile, your skin needs to be as free of germs as possible. You can reduce the number of germs on your skin by washing with CHG (chlorahexidine gluconate) Soap before surgery.  CHG is an antiseptic cleaner which kills germs and bonds with the skin to continue killing germs even after washing.    Oral Hygiene is also  important to reduce your risk of infection.    Remember - BRUSH YOUR TEETH THE MORNING OF SURGERY WITH YOUR REGULAR TOOTHPASTE  Please do not use if you have an allergy to CHG or antibacterial soaps. If your skin becomes reddened/irritated stop using the CHG.  Do not shave (including legs and underarms) for at least 48 hours prior to first CHG shower. It is OK to shave your face.  Please follow these instructions carefully.   1. Shower the NIGHT BEFORE SURGERY and the MORNING OF SURGERY with CHG.   2. If you chose to wash your hair, wash your hair first as usual with your normal shampoo.  3. After you shampoo, rinse your hair and body thoroughly to remove the shampoo.  4. Use CHG as you would any other liquid soap. You can apply CHG directly to the skin and wash gently with a scrungie or a clean washcloth.   5. Apply the CHG Soap to your body ONLY FROM THE NECK DOWN.  Do not use on open wounds or open sores. Avoid contact with your eyes, ears, mouth and genitals (private parts). Wash Face and genitals (private parts)  with your normal soap.  6. Wash thoroughly, paying special attention to the area where your surgery will be performed.  7. Thoroughly rinse your body with warm water from  the neck down.  8. DO NOT shower/wash with your normal soap after using and rinsing off the CHG Soap.  9. Pat yourself dry with a CLEAN TOWEL.  10. Wear CLEAN PAJAMAS to bed the night before surgery, wear comfortable clothes the morning of surgery  11. Place CLEAN SHEETS on your bed the night of your first shower and DO NOT SLEEP WITH PETS.  Day of Surgery:  Do not apply any deodorants/lotions.  Please wear clean clothes to the hospital/surgery center.    Remember to brush your teeth WITH YOUR REGULAR TOOTHPASTE.

## 2018-05-12 DIAGNOSIS — I2510 Atherosclerotic heart disease of native coronary artery without angina pectoris: Secondary | ICD-10-CM | POA: Insufficient documentation

## 2018-05-14 ENCOUNTER — Other Ambulatory Visit: Payer: Self-pay

## 2018-05-14 ENCOUNTER — Encounter (HOSPITAL_COMMUNITY)
Admission: RE | Admit: 2018-05-14 | Discharge: 2018-05-14 | Disposition: A | Discharge status: Home / Self Care | Payer: Medicare Other | Source: Ambulatory Visit | Attending: Orthopedic Surgery | Admitting: Orthopedic Surgery

## 2018-05-14 ENCOUNTER — Other Ambulatory Visit (HOSPITAL_COMMUNITY): Payer: Medicare Other

## 2018-05-14 ENCOUNTER — Encounter (HOSPITAL_COMMUNITY): Payer: Self-pay

## 2018-05-14 DIAGNOSIS — Z01812 Encounter for preprocedural laboratory examination: Secondary | ICD-10-CM | POA: Diagnosis not present

## 2018-05-14 LAB — BASIC METABOLIC PANEL WITH GFR
Anion gap: 10 (ref 5–15)
BUN: 18 mg/dL (ref 8–23)
CO2: 26 mmol/L (ref 22–32)
Calcium: 9.3 mg/dL (ref 8.9–10.3)
Chloride: 105 mmol/L (ref 98–111)
Creatinine, Ser: 1.03 mg/dL — ABNORMAL HIGH (ref 0.44–1.00)
GFR calc Af Amer: >60 mL/min
GFR calc non Af Amer: 55 mL/min — ABNORMAL LOW
Glucose, Bld: 126 mg/dL — ABNORMAL HIGH (ref 70–99)
Potassium: 3.9 mmol/L (ref 3.5–5.1)
Sodium: 141 mmol/L (ref 135–145)

## 2018-05-14 LAB — CBC
HCT: 44.1 % (ref 36.0–46.0)
Hemoglobin: 14.2 g/dL (ref 12.0–15.0)
MCH: 31.6 pg (ref 26.0–34.0)
MCHC: 32.2 g/dL (ref 30.0–36.0)
MCV: 98.2 fL (ref 78.0–100.0)
Platelets: 231 K/uL (ref 150–400)
RBC: 4.49 MIL/uL (ref 3.87–5.11)
RDW: 12.3 % (ref 11.5–15.5)
WBC: 6.2 K/uL (ref 4.0–10.5)

## 2018-05-14 NOTE — Progress Notes (Signed)
PCP is Alveta Heimlich, [place]  LOV 07/2017  [phone] Cardio is Dr. Royann Shivers  LOV 05/2018 Was recently admitted from ER visit in Aug. 2019.  Left shoulder to mid sternal chest pain.  Blood work drawn every 4 hrs, per the patient no there cardiac tests done.  (she did have coronary CT)She does have dx of LBBB. Notes in epic Currently denies any cp, sob, no similar sx from earlier.

## 2018-05-15 LAB — LIPID PANEL
CHOL/HDL RATIO: 2.7 ratio (ref 0.0–4.4)
Cholesterol, Total: 161 mg/dL (ref 100–199)
HDL: 60 mg/dL (ref >39)
LDL CALC: 73 mg/dL (ref 0–99)
Triglycerides: 141 mg/dL (ref 0–149)
VLDL Cholesterol Cal: 28 mg/dL (ref 5–40)

## 2018-05-15 NOTE — Progress Notes (Signed)
Anesthesia Chart Review:  Case:  [id number] Date/Time:  05/18/18 1315   Procedure:  LEFT KNEE ARTHROSCOPY WITH MEDIAL MENISECTOMY (Left Knee)   Anesthesia type:  Choice   Pre-op diagnosis:  Left knee medial meniscal tear   [facility]  [facility] ROOM 05 / [facility]   Surgeon:  Beverely Low, MD      DISCUSSION: 67 yo female former smoker for above procedure. Pertinent hx includes PONV, HTN, GERD, DOE, LBBB, Anxiety.  Pt has cardiac clearance from Dr. Royann Shivers 05/11/2018 stating "Julie Gonzales is at low risk, from cardiac standpoint, for the planned procedure.  Please make sure her beta-blocker (atenolol) is not interrupted on the time of surgery.  Please call [phone] with any additional questions."  Anticipate she can proceed as planned barring acute status change.  VS: Pulse 67   Temp 36.4 C (Oral)   Resp 20   Ht 5' 3.5" (1.613 m)   Wt 82.6 kg   SpO2 97%   BMI 31.73 kg/m   PROVIDERS: Deliah Boston, PA-C is PCP  Thurmon Fair, MD is Cardiologist last seen 05/11/2018  LABS: Labs reviewed: Acceptable for surgery. (all labs ordered are listed, but only abnormal results are displayed)  Labs Reviewed  BASIC METABOLIC PANEL - Abnormal; Notable for the following components:      Result Value   Glucose, Bld 126 (*)    Creatinine, Ser 1.03 (*)    GFR calc non Af Amer 55 (*)    All other components within normal limits  CBC    IMAGES: CHEST - 2 VIEW 04/24/2018  COMPARISON:  None.  FINDINGS: Monitoring leads overlie the patient. Normal cardiac and mediastinal contours. No consolidative pulmonary opacities. No pleural effusion or pneumothorax.  IMPRESSION: No acute cardiopulmonary process.  EKG: 04/20/2018: Sinus rhythm.  Right axis deviation.  Nonspecific intraventricular block.  Cannot rule out anteroseptal infarct, age undetermined.  T wave abnormality, consider inferolateral ischemia.  CV: Coronary CT 04/24/2018: IMPRESSION: 1. Coronary calcium score of 31. This was 72  percentile for age and sex matched control.  2. Normal coronary origin with right dominance.  3. Mild non-obstructive CAD. Risk factor modification is recommended.  TTE 07/27/2015: Study Conclusions  - Left ventricle: The cavity size was normal. Systolic function was   normal. The estimated ejection fraction was in the range of 50%   to 55%. Wall motion was normal; there were no regional wall   motion abnormalities. There was an increased relative   contribution of atrial contraction to ventricular filling.   Doppler parameters are consistent with abnormal left ventricular   relaxation (grade 1 diastolic dysfunction). - Aortic valve: Mild diffuse thickening and calcification,   consistent with sclerosis. There was mild regurgitation. - Mitral valve: Calcified annulus.  Lexiscan 07/24/2015:  The left ventricular ejection fraction is normal (55-65%).  Nuclear stress EF: 55%.  There was no ST segment deviation noted during stress.  Defect 1: There is a medium defect of mild severity present in the basal anteroseptal and mid anteroseptal location.  The study is normal.   Low risk stress nuclear study with LBBB-related septal perfusion artifact, otherwise normal perfusion, and normal left ventricular regional and global systolic function.  Past Medical History:  Diagnosis Date  . Anxiety   . Arthritis   . Dyspnea    with exertion  . Essential hypertension 07/14/2015  . GERD (gastroesophageal reflux disease)    "silent"  . Hyperlipidemia 07/14/2015  . LBBB (left bundle branch block) 07/14/2015  . PONV (  postoperative nausea and vomiting)    prolonged sedation    Past Surgical History:  Procedure Laterality Date  . ANUS SURGERY    . BREAST SURGERY Left    benign cyst removal  . CESAREAN SECTION    . CHOLECYSTECTOMY    . COLONOSCOPY    . DEBRIDEMENT LEG Right   . HYSTEROSCOPY W/D&C N/A 06/16/2017   Procedure: DILATATION AND CURETTAGE /HYSTEROSCOPY;  Surgeon:  Zelphia Cairo, MD;  Location: [facility];  Service: Gynecology;  Laterality: N/A;  . nuclear medicine stress test    . TUBAL LIGATION    . UPPER GI ENDOSCOPY      MEDICATIONS: . acetaminophen (TYLENOL) 500 MG tablet  . atenolol-chlorthalidone (TENORETIC) 50-25 MG tablet  . Cholecalciferol (VITAMIN D3) 2000 UNITS TABS  . losartan (COZAAR) 100 MG tablet  . Melatonin 10 MG TABS  . PARoxetine (PAXIL) 20 MG tablet  . potassium chloride (MICRO-K) 10 MEQ CR capsule  . pravastatin (PRAVACHOL) 40 MG tablet  . Probiotic Product (PROBIOTIC PO)   No current facility-administered medications for this encounter.      Zannie Cove [facility] Short Stay Center/Anesthesiology Phone [phone] 05/15/2018 12:47 PM

## 2018-05-18 ENCOUNTER — Encounter (HOSPITAL_COMMUNITY): Payer: Self-pay | Admitting: Certified Registered"

## 2018-05-18 ENCOUNTER — Ambulatory Visit (HOSPITAL_COMMUNITY): Payer: Medicare Other | Admitting: Anesthesiology

## 2018-05-18 ENCOUNTER — Ambulatory Visit (HOSPITAL_COMMUNITY)
Admission: RE | Admit: 2018-05-18 | Discharge: 2018-05-18 | Disposition: A | Discharge status: Home / Self Care | Payer: Medicare Other | Source: Ambulatory Visit | Attending: Orthopedic Surgery | Admitting: Orthopedic Surgery

## 2018-05-18 ENCOUNTER — Ambulatory Visit (HOSPITAL_COMMUNITY): Payer: Medicare Other | Admitting: Vascular Surgery

## 2018-05-18 ENCOUNTER — Encounter (HOSPITAL_COMMUNITY)
Admission: RE | Disposition: A | Discharge status: Home / Self Care | Payer: Self-pay | Source: Ambulatory Visit | Attending: Orthopedic Surgery

## 2018-05-18 DIAGNOSIS — Z888 Allergy status to other drugs, medicaments and biological substances status: Secondary | ICD-10-CM | POA: Insufficient documentation

## 2018-05-18 DIAGNOSIS — S83242A Other tear of medial meniscus, current injury, left knee, initial encounter: Secondary | ICD-10-CM | POA: Diagnosis present

## 2018-05-18 DIAGNOSIS — X58XXXA Exposure to other specified factors, initial encounter: Secondary | ICD-10-CM | POA: Insufficient documentation

## 2018-05-18 DIAGNOSIS — Y929 Unspecified place or not applicable: Secondary | ICD-10-CM | POA: Diagnosis not present

## 2018-05-18 DIAGNOSIS — Z79899 Other long term (current) drug therapy: Secondary | ICD-10-CM | POA: Insufficient documentation

## 2018-05-18 DIAGNOSIS — F419 Anxiety disorder, unspecified: Secondary | ICD-10-CM | POA: Insufficient documentation

## 2018-05-18 DIAGNOSIS — I447 Left bundle-branch block, unspecified: Secondary | ICD-10-CM | POA: Diagnosis not present

## 2018-05-18 DIAGNOSIS — K219 Gastro-esophageal reflux disease without esophagitis: Secondary | ICD-10-CM | POA: Diagnosis not present

## 2018-05-18 DIAGNOSIS — E785 Hyperlipidemia, unspecified: Secondary | ICD-10-CM | POA: Diagnosis not present

## 2018-05-18 DIAGNOSIS — Z87891 Personal history of nicotine dependence: Secondary | ICD-10-CM | POA: Insufficient documentation

## 2018-05-18 DIAGNOSIS — Z881 Allergy status to other antibiotic agents status: Secondary | ICD-10-CM | POA: Diagnosis not present

## 2018-05-18 DIAGNOSIS — S83232A Complex tear of medial meniscus, current injury, left knee, initial encounter: Secondary | ICD-10-CM | POA: Diagnosis not present

## 2018-05-18 DIAGNOSIS — I10 Essential (primary) hypertension: Secondary | ICD-10-CM | POA: Insufficient documentation

## 2018-05-18 HISTORY — PX: KNEE ARTHROSCOPY WITH MEDIAL MENISECTOMY: SHX5651

## 2018-05-18 SURGERY — ARTHROSCOPY, KNEE, WITH MEDIAL MENISCECTOMY
Anesthesia: General | Site: Knee | Laterality: Left

## 2018-05-18 MED ORDER — MIDAZOLAM HCL 2 MG/2ML IJ SOLN
INTRAMUSCULAR | Status: AC
  Filled 2018-05-18: qty 2

## 2018-05-18 MED ORDER — DEXAMETHASONE SODIUM PHOSPHATE 10 MG/ML IJ SOLN
INTRAMUSCULAR | Status: AC
  Filled 2018-05-18: qty 1

## 2018-05-18 MED ORDER — KETOROLAC TROMETHAMINE 30 MG/ML IJ SOLN
INTRAMUSCULAR | Status: AC
  Filled 2018-05-18: qty 1

## 2018-05-18 MED ORDER — DEXAMETHASONE SODIUM PHOSPHATE 10 MG/ML IJ SOLN
INTRAMUSCULAR | Status: DC | PRN
  Administered 2018-05-18: 10 mg via INTRAVENOUS

## 2018-05-18 MED ORDER — BUPIVACAINE-EPINEPHRINE (PF) 0.25% -1:200000 IJ SOLN
INTRAMUSCULAR | Status: DC | PRN
  Administered 2018-05-18: 10 mL via PERINEURAL

## 2018-05-18 MED ORDER — CEFAZOLIN SODIUM-DEXTROSE 2-4 GM/100ML-% IV SOLN
INTRAVENOUS | Status: AC
  Filled 2018-05-18: qty 100

## 2018-05-18 MED ORDER — ONDANSETRON HCL 4 MG/2ML IJ SOLN
INTRAMUSCULAR | Status: AC
  Filled 2018-05-18: qty 2

## 2018-05-18 MED ORDER — CHLORHEXIDINE GLUCONATE 4 % EX LIQD: 60.0000 mL | Freq: Once | CUTANEOUS | Status: DC

## 2018-05-18 MED ORDER — LACTATED RINGERS IV SOLN
INTRAVENOUS | Status: DC
  Administered 2018-05-18: 13:00:00 via INTRAVENOUS

## 2018-05-18 MED ORDER — PROPOFOL 10 MG/ML IV BOLUS
INTRAVENOUS | Status: DC | PRN
  Administered 2018-05-18: 50 mg via INTRAVENOUS
  Administered 2018-05-18: 150 mg via INTRAVENOUS

## 2018-05-18 MED ORDER — PROMETHAZINE HCL 25 MG/ML IJ SOLN
INTRAMUSCULAR | Status: AC
  Administered 2018-05-18: 6.25 mg via INTRAVENOUS
  Filled 2018-05-18: qty 1

## 2018-05-18 MED ORDER — FENTANYL CITRATE (PF) 250 MCG/5ML IJ SOLN
INTRAMUSCULAR | Status: AC
  Filled 2018-05-18: qty 5

## 2018-05-18 MED ORDER — SODIUM CHLORIDE 0.9 % IR SOLN
Status: DC | PRN
  Administered 2018-05-18 (×2): 3000 mL

## 2018-05-18 MED ORDER — HYDROMORPHONE HCL 1 MG/ML IJ SOLN
0.2500 mg | INTRAMUSCULAR | Status: DC | PRN
  Administered 2018-05-18: 0.5 mg via INTRAVENOUS

## 2018-05-18 MED ORDER — MEPERIDINE HCL 50 MG/ML IJ SOLN: 6.2500 mg | INTRAMUSCULAR | Status: DC | PRN

## 2018-05-18 MED ORDER — OXYCODONE HCL 5 MG PO TABS: 5.0000 mg | ORAL_TABLET | Freq: Once | ORAL | Status: DC | PRN

## 2018-05-18 MED ORDER — LIDOCAINE 2% (20 MG/ML) 5 ML SYRINGE
INTRAMUSCULAR | Status: AC
  Filled 2018-05-18: qty 5

## 2018-05-18 MED ORDER — ONDANSETRON HCL 4 MG/2ML IJ SOLN
INTRAMUSCULAR | Status: DC | PRN
  Administered 2018-05-18: 4 mg via INTRAVENOUS

## 2018-05-18 MED ORDER — CEFAZOLIN SODIUM-DEXTROSE 2-4 GM/100ML-% IV SOLN
2.0000 g | INTRAVENOUS | Status: AC
  Administered 2018-05-18: 2 g via INTRAVENOUS

## 2018-05-18 MED ORDER — ONDANSETRON HCL 4 MG PO TABS: 4.0000 mg | ORAL_TABLET | Freq: Three times a day (TID) | ORAL | 0 refills | Status: AC | PRN

## 2018-05-18 MED ORDER — ALBUTEROL SULFATE HFA 108 (90 BASE) MCG/ACT IN AERS
INHALATION_SPRAY | RESPIRATORY_TRACT | Status: AC
  Filled 2018-05-18: qty 6.7

## 2018-05-18 MED ORDER — BUPIVACAINE-EPINEPHRINE (PF) 0.25% -1:200000 IJ SOLN
INTRAMUSCULAR | Status: AC
  Filled 2018-05-18: qty 30

## 2018-05-18 MED ORDER — GLYCOPYRROLATE PF 0.2 MG/ML IJ SOSY
PREFILLED_SYRINGE | INTRAMUSCULAR | Status: DC | PRN
  Administered 2018-05-18: .2 mg via INTRAVENOUS

## 2018-05-18 MED ORDER — LIDOCAINE 2% (20 MG/ML) 5 ML SYRINGE
INTRAMUSCULAR | Status: DC | PRN
  Administered 2018-05-18: 60 mg via INTRAVENOUS

## 2018-05-18 MED ORDER — HYDROCODONE-ACETAMINOPHEN 5-325 MG PO TABS: 1.0000 | ORAL_TABLET | ORAL | 0 refills | Status: AC | PRN

## 2018-05-18 MED ORDER — HYDROMORPHONE HCL 1 MG/ML IJ SOLN
INTRAMUSCULAR | Status: AC
  Filled 2018-05-18: qty 1

## 2018-05-18 MED ORDER — PROPOFOL 10 MG/ML IV BOLUS
INTRAVENOUS | Status: AC
  Filled 2018-05-18: qty 20

## 2018-05-18 MED ORDER — MIDAZOLAM HCL 5 MG/5ML IJ SOLN
INTRAMUSCULAR | Status: DC | PRN
  Administered 2018-05-18: 2 mg via INTRAVENOUS

## 2018-05-18 MED ORDER — FENTANYL CITRATE (PF) 100 MCG/2ML IJ SOLN
INTRAMUSCULAR | Status: DC | PRN
  Administered 2018-05-18: 100 ug via INTRAVENOUS
  Administered 2018-05-18: 50 ug via INTRAVENOUS

## 2018-05-18 MED ORDER — OXYCODONE HCL 5 MG/5ML PO SOLN: 5.0000 mg | Freq: Once | ORAL | Status: DC | PRN

## 2018-05-18 MED ORDER — ALBUTEROL SULFATE HFA 108 (90 BASE) MCG/ACT IN AERS
INHALATION_SPRAY | RESPIRATORY_TRACT | Status: DC | PRN
  Administered 2018-05-18: 5 via RESPIRATORY_TRACT

## 2018-05-18 MED ORDER — PROMETHAZINE HCL 25 MG/ML IJ SOLN
6.2500 mg | INTRAMUSCULAR | Status: AC | PRN
  Administered 2018-05-18 (×2): 6.25 mg via INTRAVENOUS

## 2018-05-18 MED ORDER — KETOROLAC TROMETHAMINE 30 MG/ML IJ SOLN
INTRAMUSCULAR | Status: DC | PRN
  Administered 2018-05-18: 30 mg via INTRAVENOUS

## 2018-05-18 MED ORDER — ASPIRIN 81 MG PO CHEW: 81.0000 mg | CHEWABLE_TABLET | Freq: Every day | ORAL | 0 refills | Status: AC

## 2018-05-18 MED ORDER — METHOCARBAMOL 500 MG PO TABS: 500.0000 mg | ORAL_TABLET | Freq: Three times a day (TID) | ORAL | 1 refills | Status: AC | PRN

## 2018-05-18 SURGICAL SUPPLY — 41 items
BANDAGE ELASTIC 6 VELCRO ST LF (GAUZE/BANDAGES/DRESSINGS) ×3 IMPLANT
BLADE CUTTER GATOR 3.5 (BLADE) IMPLANT
BLADE SURG 11 STRL SS (BLADE) ×3 IMPLANT
BNDG CMPR MED 10X6 ELC LF (GAUZE/BANDAGES/DRESSINGS) ×1
BNDG COHESIVE 6X5 TAN STRL LF (GAUZE/BANDAGES/DRESSINGS) ×3 IMPLANT
BNDG ELASTIC 6X10 VLCR STRL LF (GAUZE/BANDAGES/DRESSINGS) ×3 IMPLANT
BNDG GAUZE ELAST 4 BULKY (GAUZE/BANDAGES/DRESSINGS) ×3 IMPLANT
CLOSURE STERI-STRIP 1/2X4 (GAUZE/BANDAGES/DRESSINGS) ×1
CLOSURE WOUND 1/2 X4 (GAUZE/BANDAGES/DRESSINGS) ×1
CLSR STERI-STRIP ANTIMIC 1/2X4 (GAUZE/BANDAGES/DRESSINGS) ×2 IMPLANT
DRAPE ARTHROSCOPY W/POUCH 114 (DRAPES) ×3 IMPLANT
DURAPREP 26ML APPLICATOR (WOUND CARE) ×3 IMPLANT
ELECT MENISCUS 165MM 90D (ELECTRODE) IMPLANT
ELECT REM PT RETURN 9FT ADLT (ELECTROSURGICAL) ×3
ELECTRODE REM PT RTRN 9FT ADLT (ELECTROSURGICAL) ×1 IMPLANT
GAUZE SPONGE 4X4 12PLY STRL (GAUZE/BANDAGES/DRESSINGS) IMPLANT
GAUZE SPONGE 4X4 12PLY STRL LF (GAUZE/BANDAGES/DRESSINGS) ×3 IMPLANT
GLOVE BIOGEL PI ORTHO PRO SZ8 (GLOVE) ×2
GLOVE PI ORTHO PRO STRL SZ8 (GLOVE) ×1 IMPLANT
GLOVE SURG ORTHO 8.5 STRL (GLOVE) ×3 IMPLANT
GOWN STRL REUS W/ TWL XL LVL3 (GOWN DISPOSABLE) ×2 IMPLANT
GOWN STRL REUS W/TWL XL LVL3 (GOWN DISPOSABLE) ×6
KIT BASIN OR (CUSTOM PROCEDURE TRAY) ×3 IMPLANT
KIT TURNOVER KIT B (KITS) ×3 IMPLANT
MANIFOLD NEPTUNE II (INSTRUMENTS) ×3 IMPLANT
NEEDLE SPNL 18GX3.5 QUINCKE PK (NEEDLE) ×3 IMPLANT
PACK ARTHROSCOPY DSU (CUSTOM PROCEDURE TRAY) IMPLANT
PAD ABD 8X10 STRL (GAUZE/BANDAGES/DRESSINGS) ×3 IMPLANT
PAD ARMBOARD 7.5X6 YLW CONV (MISCELLANEOUS) ×6 IMPLANT
PENCIL BUTTON HOLSTER BLD 10FT (ELECTRODE) IMPLANT
SET ARTHROSCOPY TUBING (MISCELLANEOUS)
SET ARTHROSCOPY TUBING LN (MISCELLANEOUS) IMPLANT
SPONGE LAP 18X18 X RAY DECT (DISPOSABLE) ×3 IMPLANT
STRIP CLOSURE SKIN 1/2X4 (GAUZE/BANDAGES/DRESSINGS) ×2 IMPLANT
SUT MNCRL AB 4-0 PS2 18 (SUTURE) ×3 IMPLANT
TOWEL OR 17X24 6PK STRL BLUE (TOWEL DISPOSABLE) ×3 IMPLANT
TUBE CONNECTING 12'X1/4 (SUCTIONS) ×1
TUBE CONNECTING 12X1/4 (SUCTIONS) ×2 IMPLANT
WAND HAND CNTRL MULTIVAC 90 (MISCELLANEOUS) IMPLANT
WATER STERILE IRR 1000ML POUR (IV SOLUTION) ×3 IMPLANT
WRAP KNEE MAXI GEL POST OP (GAUZE/BANDAGES/DRESSINGS) ×3 IMPLANT

## 2018-05-18 NOTE — Anesthesia Procedure Notes (Signed)
Procedure Name: LMA Insertion Date/Time: 05/18/2018 3:53 PM Performed by: Shireen QuanButler, Cosette Prindle R, CRNA Pre-anesthesia Checklist: Patient identified, Emergency Drugs available, Suction available and Patient being monitored Patient Re-evaluated:Patient Re-evaluated prior to induction Oxygen Delivery Method: Circle System Utilized Preoxygenation: Pre-oxygenation with 100% oxygen Induction Type: IV induction Ventilation: Mask ventilation without difficulty LMA: LMA inserted LMA Size: 4.0 Number of attempts: 1 Placement Confirmation: positive ETCO2 Tube secured with: Tape Dental Injury: Teeth and Oropharynx as per pre-operative assessment

## 2018-05-18 NOTE — Transfer of Care (Signed)
Immediate Anesthesia Transfer of Care Note  Patient: Julie Gonzales  Procedure(s) Performed: LEFT KNEE ARTHROSCOPY WITH MEDIAL MENISECTOMY (Left Knee)  Patient Location: PACU  Anesthesia Type:General  Level of Consciousness: awake, oriented and patient cooperative  Airway & Oxygen Therapy: Patient Spontanous Breathing and Patient connected to nasal cannula oxygen  Post-op Assessment: Report given to RN, Post -op Vital signs reviewed and stable and Patient moving all extremities  Post vital signs: Reviewed and stable  Last Vitals:  Vitals Value Taken Time  BP 128/64 05/18/2018  5:00 PM  Temp    Pulse 91 05/18/2018  5:02 PM  Resp 14 05/18/2018  5:02 PM  SpO2 93 % 05/18/2018  5:02 PM  Vitals shown include unvalidated device data.  Last Pain:  Vitals:   05/18/18 1155  TempSrc: Oral         Complications: No apparent anesthesia complications

## 2018-05-18 NOTE — Brief Op Note (Signed)
05/18/2018  5:03 PM  PATIENT:  Doreen BeamMichelle I Quintela  67 y.o. female  PRE-OPERATIVE DIAGNOSIS:  Left knee medial meniscal tear  POST-OPERATIVE DIAGNOSIS:  Left knee medial meniscal tear, chondromalacia medial and patellofemoral compartments  PROCEDURE:  Procedure(s): LEFT KNEE ARTHROSCOPY WITH MEDIAL MENISECTOMY (Left) Chondroplasty  SURGEON:  Surgeon(s) and Role:    Beverely Low* Kariah Loredo, MD - Primary  PHYSICIAN ASSISTANT:   ASSISTANTS: none   ANESTHESIA:   general  EBL:  None    BLOOD ADMINISTERED:none  DRAINS: none   LOCAL MEDICATIONS USED:  MARCAINE     SPECIMEN:  No Specimen  DISPOSITION OF SPECIMEN:  N/A  COUNTS:  YES  TOURNIQUET:  * No tourniquets in log *  DICTATION: .Other Dictation: Dictation Number [id number]  PLAN OF CARE: Discharge to home after PACU  PATIENT DISPOSITION:  PACU - hemodynamically stable.   Delay start of Pharmacological VTE agent (>24hrs) due to surgical blood loss or risk of bleeding: no

## 2018-05-18 NOTE — Discharge Instructions (Addendum)
Place minimal weight on the left leg with the crutches and or walker until you see Dr Ranell PatrickNorris  Keep ice on the knee as much as you can.  Keep the incisions covered and clean and dry for one week, then ok to get them wet in the shower.  Remove the wrap and bandages on Sunday and place BandAids over the SteriStrips and then place the support stocking on the left leg.  Wear the stockings on both legs full time for 30 days to prevent blood clots.    Take a Baby Aspirin once per day  Do exercises every hour for 5 minutes - calf pumps (pump ankle up and down) - heel slides, resting your heel on the bed or floor slide it towards you and then away from you. - straight leg raise - keep leg straight and the lift up and down  Follow up in the office in two weeks, call [phone] for appointment

## 2018-05-18 NOTE — Anesthesia Preprocedure Evaluation (Addendum)
Anesthesia Evaluation  Patient identified by MRN, date of birth, ID band Patient awake    Reviewed: Allergy & Precautions, NPO status , Patient's Chart, lab work & pertinent test results  History of Anesthesia Complications (+) PONV and history of anesthetic complications  Airway Mallampati: II  TM Distance: >3 FB Neck ROM: Full    Dental no notable dental hx.    Pulmonary former smoker,    Pulmonary exam normal breath sounds clear to auscultation       Cardiovascular hypertension, Pt. on home beta blockers and Pt. on medications Normal cardiovascular exam Rhythm:Regular Rate:Normal  ECG: ECG: NSR, RAD, IV block, rate 72   Neuro/Psych Anxiety negative neurological ROS     GI/Hepatic negative GI ROS, Neg liver ROS,   Endo/Other  negative endocrine ROS  Renal/GU negative Renal ROS     Musculoskeletal negative musculoskeletal ROS (+)   Abdominal (+) + obese,   Peds  Hematology HLD   Anesthesia Other Findings Left knee medial meniscal tear  Reproductive/Obstetrics                            Anesthesia Physical Anesthesia Plan  ASA: II  Anesthesia Plan: General   Post-op Pain Management:    Induction: Intravenous  PONV Risk Score and Plan: 4 or greater and Dexamethasone, Ondansetron, Midazolam, Scopolamine patch - Pre-op and Treatment may vary due to age or medical condition  Airway Management Planned: LMA  Additional Equipment:   Intra-op Plan:   Post-operative Plan: Extubation in OR  Informed Consent: I have reviewed the patients History and Physical, chart, labs and discussed the procedure including the risks, benefits and alternatives for the proposed anesthesia with the patient or authorized representative who has indicated his/her understanding and acceptance.   Dental advisory given  Plan Discussed with: CRNA  Anesthesia Plan Comments:        Anesthesia Quick  Evaluation

## 2018-05-18 NOTE — Progress Notes (Signed)
Orthopedic Tech Progress Note Patient Details:  Julie BeamMichelle I Gonzales Jun 24, 1951 [id number]  Ortho Devices Type of Ortho Device: Crutches   Post Interventions Instructions Provided: Care of device   Saul FordyceJennifer C Diavion Gonzales 05/18/2018, 6:39 PM

## 2018-05-18 NOTE — Interval H&P Note (Signed)
History and Physical Interval Note:  05/18/2018 3:34 PM  Julie BeamMichelle I Altmann  has presented today for surgery, with the diagnosis of Left knee medial meniscal tear  The various methods of treatment have been discussed with the patient and family. After consideration of risks, benefits and other options for treatment, the patient has consented to  Procedure(s): LEFT KNEE ARTHROSCOPY WITH MEDIAL MENISECTOMY (Left) as a surgical intervention .  The patient's history has been reviewed, patient examined, no change in status, stable for surgery.  I have reviewed the patient's chart and labs.  Questions were answered to the patient's satisfaction.     Abbe Bula,STEVEN R

## 2018-05-18 NOTE — Progress Notes (Signed)
Orthopedic Tech Progress Note Patient Details:  Julie Gonzales 1951/07/31 [id number]  Patient ID: Julie Gonzales, female   DOB: 1951/07/31, 67 y.o.   MRN: [id number] I completed crutch training in PACU pre discharge.  Trinna PostMartinez, [place] Kirk J 05/18/2018, 8:51 PM

## 2018-05-19 NOTE — Op Note (Signed)
NAMDoreen Beam: Gonzales, Julie I. MEDICAL RECORD [id number] ACCOUNT [id number] DATE OF BIRTH:Jul 25, 1951 FACILITY: [facility] LOCATION: [facility] PHYSICIAN:STEVEN Russ Halo. Deosha Werden, MD  OPERATIVE REPORT  DATE OF PROCEDURE:  05/18/2018  PREOPERATIVE DIAGNOSES: 1.  Left knee medial meniscus tear. 2.  Chondromalacia medial and patellofemoral.  POSTOPERATIVE DIAGNOSES: 1.  Left knee medial meniscus tear. 2.  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  PROCEDURE PERFORMED:  Left knee arthroscopy, partial medial meniscectomy and chondroplasty, debridement type medial compartment and patellofemoral compartment.  ATTENDING SURGEON:  Malon KindleSteven Jamika Sadek, MD  ASSISTANT:  None.  ANESTHESIA:  General anesthesia was used.  ESTIMATED BLOOD LOSS:  Minimal.  FLUID REPLACEMENT:  1000 mL crystalloid.  INSTRUMENT COUNTS:  Correct.  COMPLICATIONS:  There were no complications.  ANTIBIOTICS:  Perioperative antibiotics were given.  INDICATIONS:  The patient is a 67 year old female with worsening left medial knee pain secondary to a torn meniscus.  She has had progressive pain despite conservative management and presents for operative treatment to restore function and eliminate  pain.  Informed consent was obtained.  DESCRIPTION OF PROCEDURE:  After an adequate level of anesthesia was achieved, the patient was positioned supine on the operating table.  Left leg was correctly identified, sterilely prepped and draped in the normal manner.  Timeout was called.  We  initiated surgery with infiltration of skin with 0.25% Marcaine with epinephrine, followed by incision with the 11-blade scalpel and introduction of the cannula into the joint using blunt obturators.  We instituted a superolateral outflow portal,  anterolateral scope, and anteromedial working portals.  We identified severe chondromalacia in the patellofemoral joint with grade IV eburnated bone.   Loose flaps and fibrillation encountered in the patellofemoral up in the underside of the patella, and  we used tangential technique with the suction shaver to remove that.  Medial and lateral gutters were inspected and free of loose bodies.  The medial compartment was entered.  There was a complex posterior horn medial meniscus tear with a large  parrot-beak flap.  We removed that using basket forceps and motorized shaver.  We had to remove about 50% of the posterior horn of the medial meniscus.  The remaining meniscal tissue was probed and felt to be stable.  Meniscal root integrity was intact.   There was grade III weightbearing articular cartilage damage.  We did use the suction shaver with tangential technique to remove the unstable flaps and fibrillation.  ACL and PCL were intact.  Lateral compartment was pristine with the exception of 1  small area of maybe 1 x 1 cm of grade III chondromalacia that was debrided using suction shaver, so chondromalacia in 3 compartments, but debridement done in 2 compartments and then a partial meniscectomy.  We concluded surgery, sutured the wounds with  4-0 Monocryl followed by Steri-Strips and a sterile compressive bandage.  The patient was awakened and transported to recovery room in stable condition.  LN/NUANCE  D:05/18/2018 T:05/19/2018 JOB:[id number]

## 2018-05-21 ENCOUNTER — Encounter (HOSPITAL_COMMUNITY): Payer: Self-pay | Admitting: Orthopedic Surgery

## 2018-06-03 NOTE — Anesthesia Postprocedure Evaluation (Signed)
Anesthesia Post Note  Patient: Julie Gonzales  Procedure(s) Performed: LEFT KNEE ARTHROSCOPY WITH MEDIAL MENISECTOMY (Left Knee)     Patient location during evaluation: PACU Anesthesia Type: General Level of consciousness: awake Pain management: pain level controlled Vital Signs Assessment: post-procedure vital signs reviewed and stable Respiratory status: spontaneous breathing Cardiovascular status: stable Postop Assessment: no apparent nausea or vomiting Anesthetic complications: no    Last Vitals:  Vitals:   05/18/18 2000 05/18/18 2015  BP: (!) 137/57 (!) 139/58  Pulse: 76 61  Resp: 17 14  Temp: 36.7 C   SpO2: 91% 93%    Last Pain:  Vitals:   05/18/18 2000  TempSrc:   PainSc: 3    Pain Goal: Patients Stated Pain Goal: 3 (05/18/18 2000)               Kristin Barcus JR,JOHN Susann Givens

## 2018-06-05 ENCOUNTER — Telehealth: Payer: Self-pay | Admitting: Cardiovascular Disease

## 2018-06-05 NOTE — Telephone Encounter (Signed)
Call came in from [facility] with [facility] [phone]) she asked who be signing d/c on this patient. After speaking with Cynthina Dr.Croitoru will be signing.    Called Hallie back she is aware and will deliver to [employer]. Office.

## 2018-06-05 DEATH — deceased

## 2018-06-10 ENCOUNTER — Encounter: Payer: Self-pay | Admitting: Cardiovascular Disease

## 2018-06-10 NOTE — Progress Notes (Signed)
Received a request to complete the death certificate for Mrs. Birnie from [facility] on Friday 06/08/2018. Have tried to reach Mr. Shampine on Friday and again on Sunday, left a message expressing condolences. Called the funeral home on Friday and found out that the funeral services were occurring that morning. The person answering the phone did not know many details about the nature of Mrs. Lorenzi demise. Asked for the funeral director to contact me, but have not heard so far from him. I am unable to complete the death certificate without more information. Her unfortunate demise was not expected. Thurmon Fair, MD, [facility] [phone] office [phone] pager

## 2020-03-31 IMAGING — CR DG CHEST 2V
2 series · 2 of 2 positions shown · non-contrast
Comparison: None.

CLINICAL DATA: Patient with sharp left-sided chest pain.

EXAM:
CHEST - 2 VIEW

[chest pa]
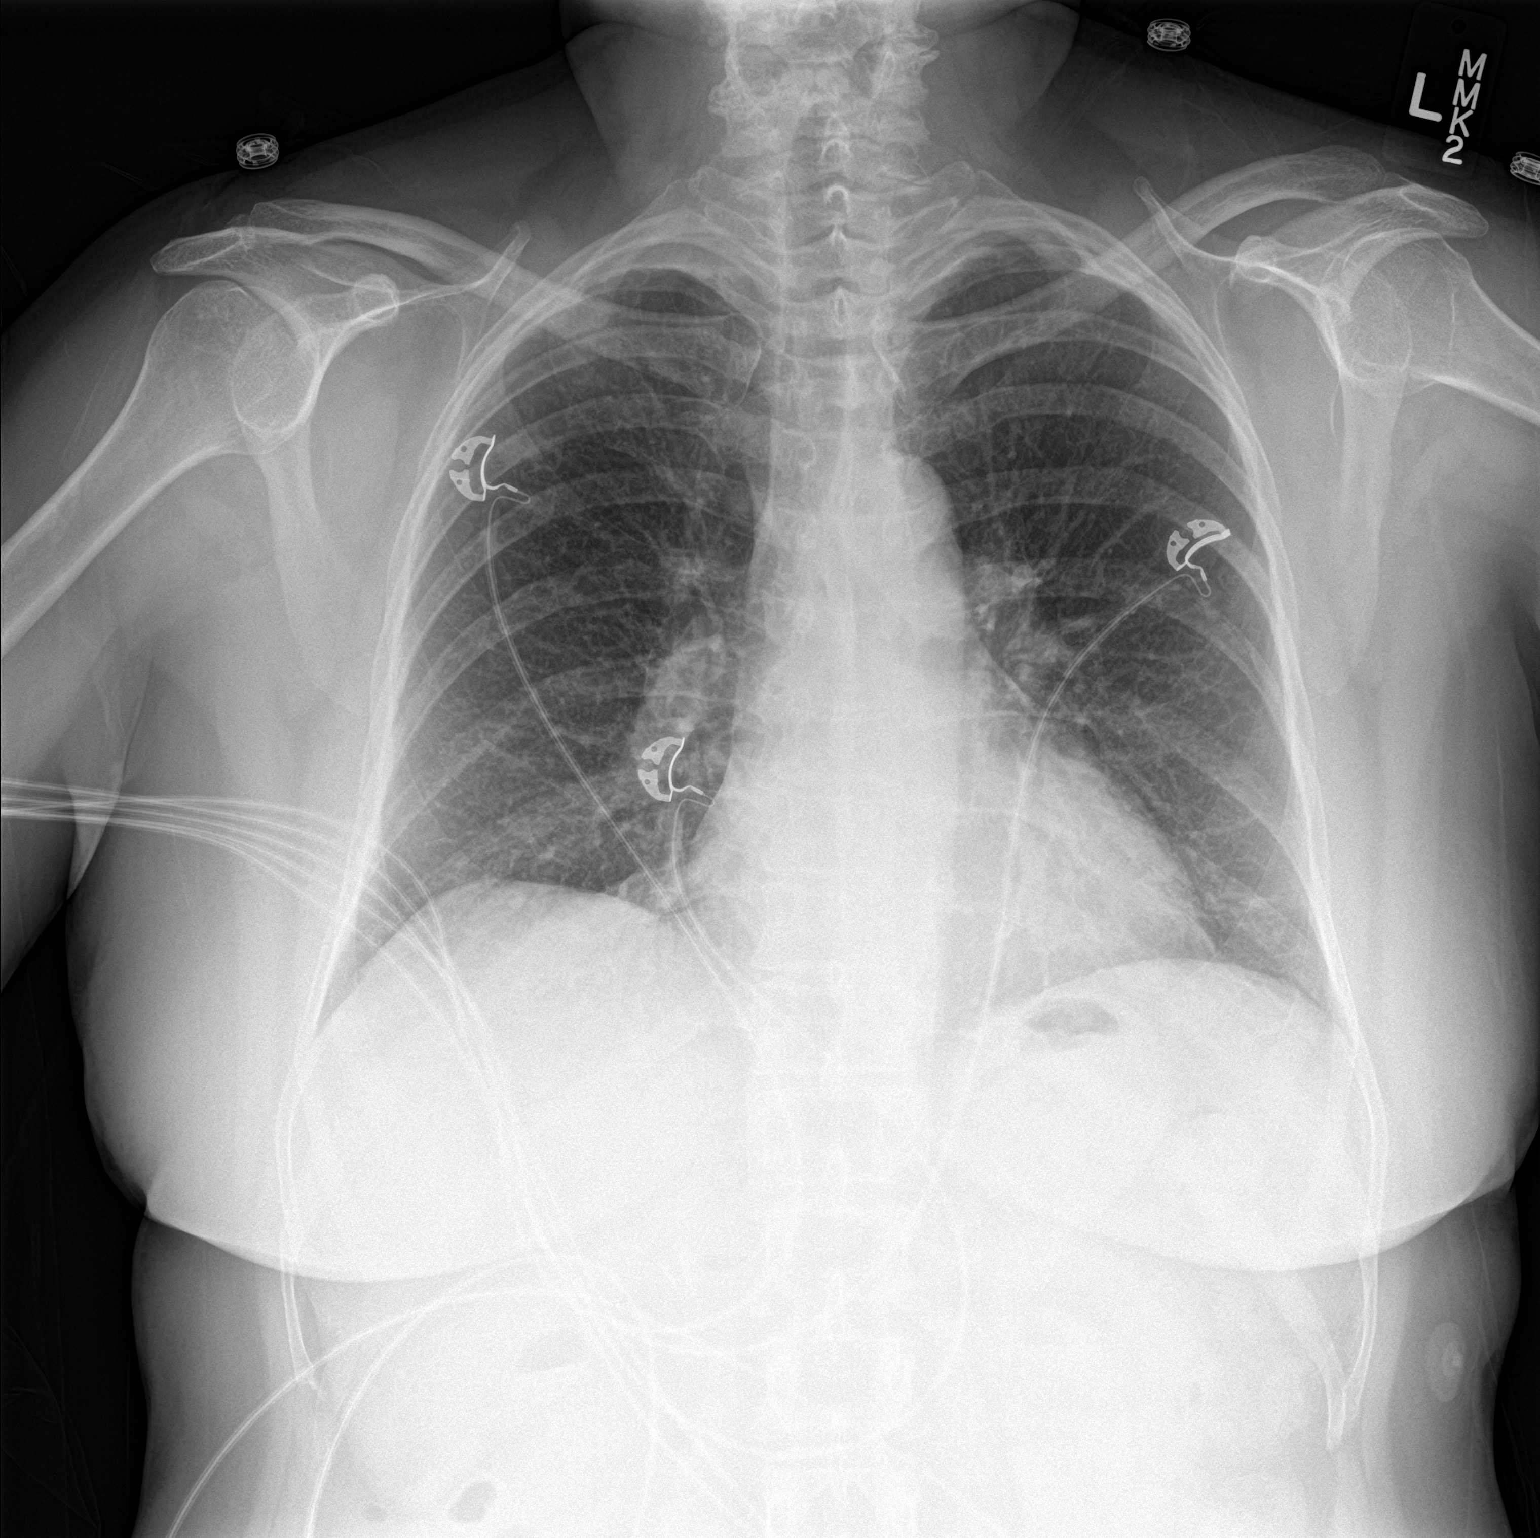

[chest lat]
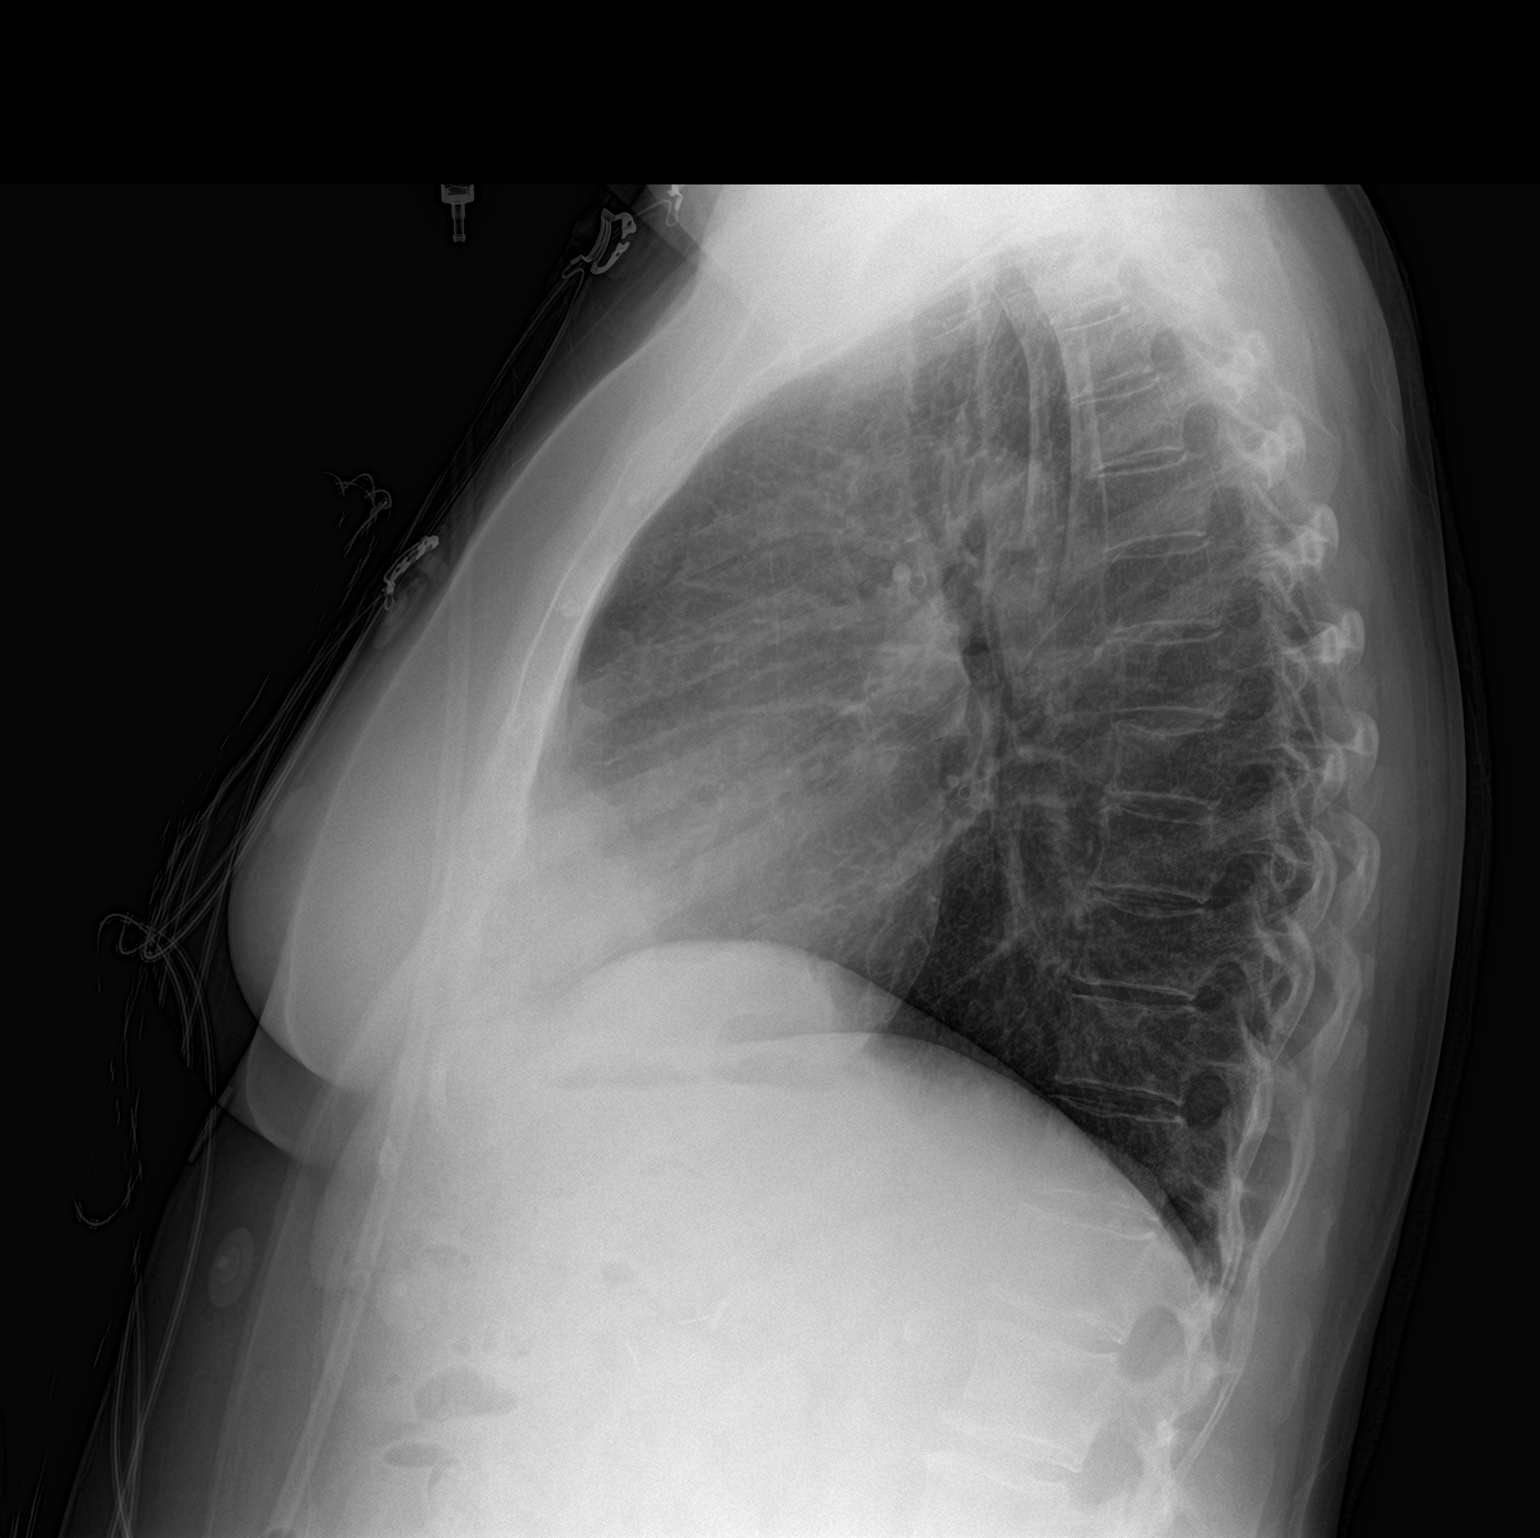

[2 of 2 positions shown; findings below may reference images not displayed]

FINDINGS: Monitoring leads overlie the patient. Normal cardiac and mediastinal
contours. No consolidative pulmonary opacities. No pleural effusion
or pneumothorax.
IMPRESSION: No acute cardiopulmonary process.

## 2020-04-04 IMAGING — CT CT HEART MORP W/ CTA COR W/ SCORE W/ CA W/CM &/OR W/O CM
4 of 7 series · 8 of 20 positions shown, 9 images · IV contrast (APPLIED)
Comparison: None.

EXAM:
OVER-READ INTERPRETATION  CT CHEST

The following report is an over-read performed by radiologist Dr.
over-read does not include interpretation of cardiac or coronary
anatomy or pathology. The coronary CTA interpretation by the
cardiologist is attached.
CLINICAL DATA: 66-year-old female with hypertension, hyperlipidemia
and chest pain.
Cardiac/Coronary  CT
TECHNIQUE: The patient was scanned on a [employer] Force scanner.

[Series 6: best diast 70 % · axial · 0.39mm/px · z∈[+655,+696]mm · 2 of 306 slices shown, 3 images]
[im 102/306  vessel]
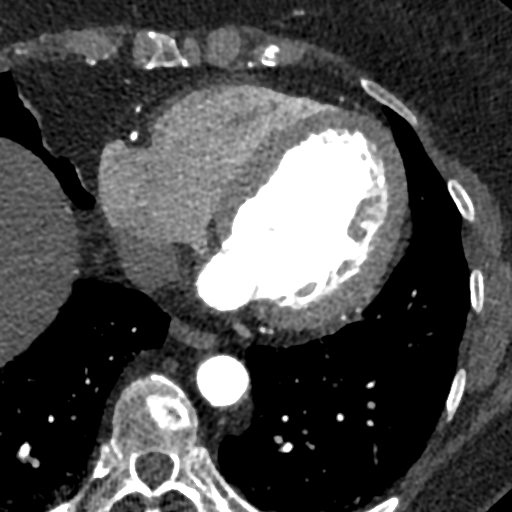
[im 102/306  lung]
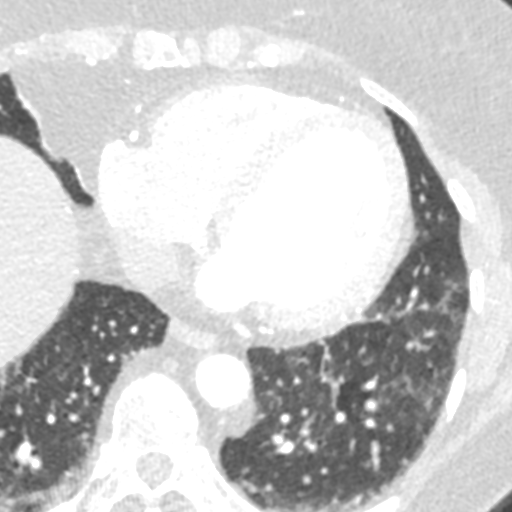
[im 204/306  vessel]
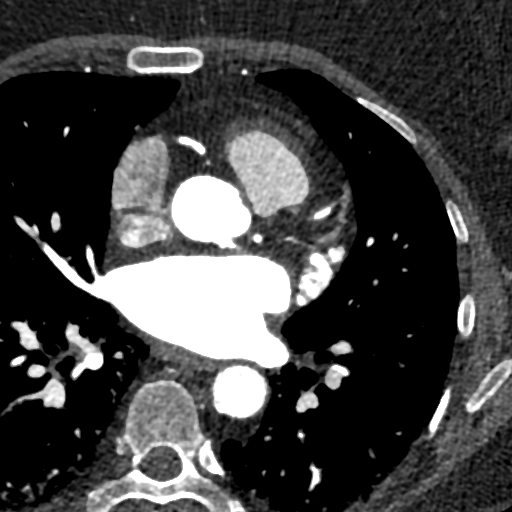

[Series 7: best syst 41 % · axial · 0.39mm/px · z∈[+655,+696]mm · 2 of 306 slices shown]
[im 102/306  vessel]
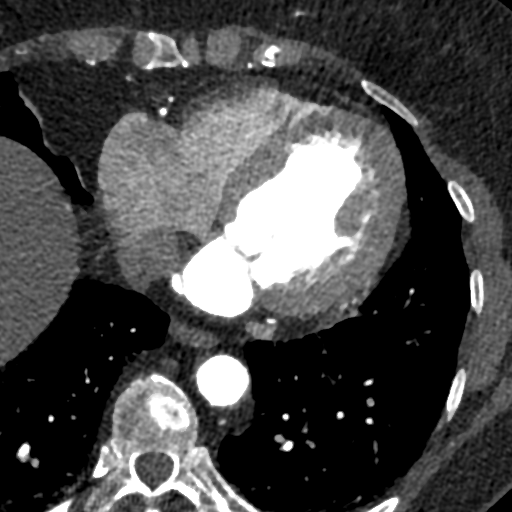
[im 204/306  vessel]
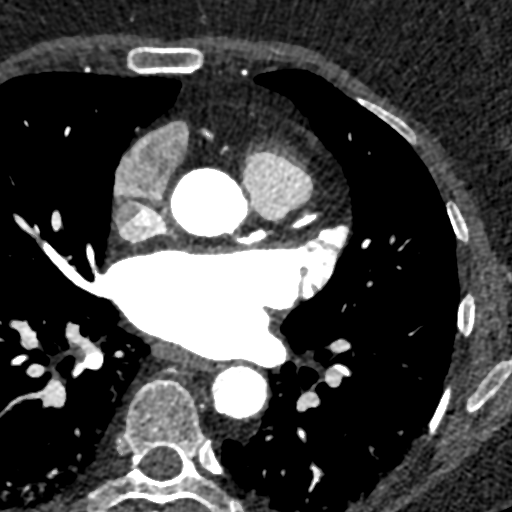

[Series 8: ts diast sharp 70 % · axial · 0.39mm/px · z∈[+655,+696]mm · 2 of 306 slices shown]
[im 102/306  lung]
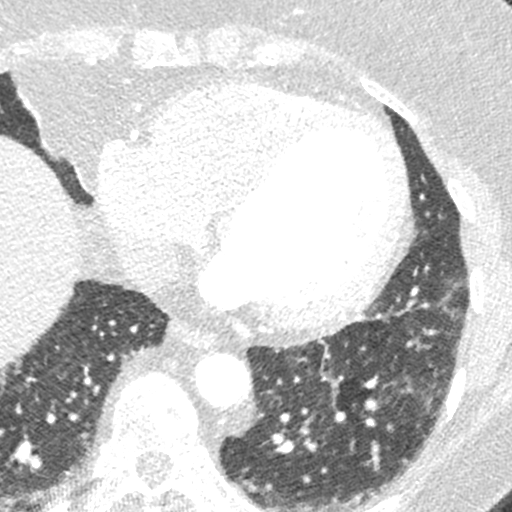
[im 204/306  lung]
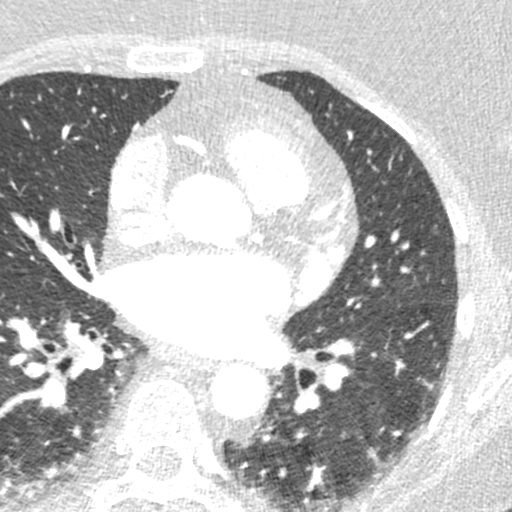

[Series 9: ts syst sharp 41 % · axial · 0.39mm/px · z∈[+655,+696]mm · 2 of 306 slices shown]
[im 102/306  lung]
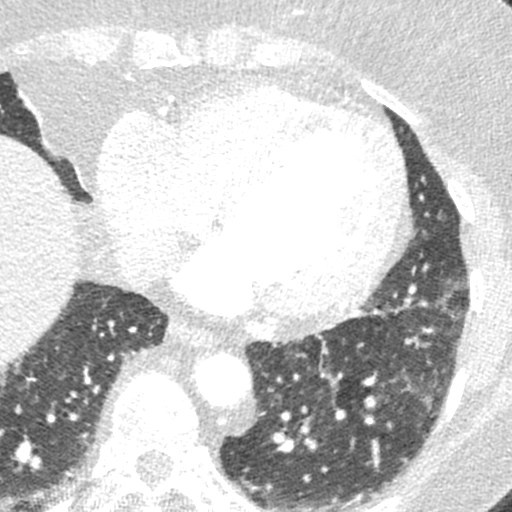
[im 204/306  lung]
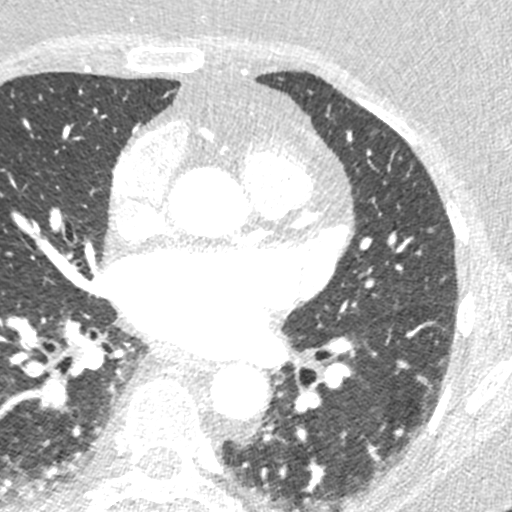

[8 of 20 positions shown; findings below may reference images not displayed]

FINDINGS: The visualized portions of the mediastinum are unremarkable. No
adenopathy identified.

No pleural effusion. The no airspace consolidation or atelectasis
identified.

No acute findings identified within the visualized portion of the
upper abdomen.

No significant osseous abnormalities identified.
IMPRESSION: 1. Negative over-read.
FINDINGS: A 120 kV prospective scan was triggered in the descending thoracic
aorta at 111 HU's. Axial non-contrast 3 mm slices were carried out
through the heart. The data set was analyzed on a dedicated work
station and scored using the Agatson method. Gantry rotation speed
was 250 msecs and collimation was .6 mm. 10 mg of iv Metoprolol and
0.8 mg of sl NTG was given. The 3D data set was reconstructed in 5%
intervals of the 67-82 % of the R-R cycle. Diastolic phases were
analyzed on a dedicated work station using MPR, MIP and VRT modes.
The patient received 80 cc of contrast.

Aorta:  Normal size.  Mild diffuse calcifications.  No dissection.

Aortic Valve:  Trileaflet.  No calcifications.

Coronary Arteries:  Normal coronary origin.  Right dominance.

RCA is a large dominant artery that gives rise to PDA and PLVB.
There is no plaque.

Left main is a large artery that gives rise to LAD and LCX arteries.

LAD is a large vessel that has mild calcified plaque in the proximal
segment with stenosis 0-25%.

LCX is a non-dominant artery that gives rise to one large OM1
branch. There is no plaque.

Other findings:

Normal pulmonary vein drainage into the left atrium.

Normal let atrial appendage without a thrombus.

Normal size of the pulmonary artery.
IMPRESSION: 1. Coronary calcium score of 31. This was 72 percentile for age and
sex matched control.

2. Normal coronary origin with right dominance.

3. Mild non-obstructive CAD. Risk factor modification is
recommended.
